# Patient Record
Sex: Male | Born: 1950 | Race: White | Hispanic: No | Marital: Married | Smoking: Never smoker
Health system: Southern US, Community
[De-identification: ages and names within clinical notes are randomized; demographics above are authoritative.]

## PROBLEM LIST (undated history)

## (undated) DIAGNOSIS — H409 Unspecified glaucoma: Secondary | ICD-10-CM

## (undated) DIAGNOSIS — K219 Gastro-esophageal reflux disease without esophagitis: Secondary | ICD-10-CM

## (undated) HISTORY — DX: Unspecified glaucoma: H40.9

## (undated) HISTORY — PX: SINOSCOPY: SHX187

## (undated) HISTORY — PX: OTHER SURGICAL HISTORY: SHX169

## (undated) HISTORY — DX: Gastro-esophageal reflux disease without esophagitis: K21.9

---

## 1998-05-04 ENCOUNTER — Ambulatory Visit (HOSPITAL_COMMUNITY): Admission: RE | Admit: 1998-05-04 | Discharge: 1998-05-04 | Payer: Self-pay | Admitting: Gastroenterology

## 1998-05-09 ENCOUNTER — Other Ambulatory Visit: Admission: RE | Admit: 1998-05-09 | Discharge: 1998-05-09 | Payer: Self-pay | Admitting: Urology

## 1999-06-05 ENCOUNTER — Encounter (INDEPENDENT_AMBULATORY_CARE_PROVIDER_SITE_OTHER): Payer: Self-pay | Admitting: Specialist

## 1999-06-05 ENCOUNTER — Encounter: Payer: Self-pay | Admitting: Emergency Medicine

## 1999-06-05 ENCOUNTER — Encounter: Payer: Self-pay | Admitting: Surgery

## 1999-06-05 ENCOUNTER — Inpatient Hospital Stay (HOSPITAL_COMMUNITY): Admission: EM | Admit: 1999-06-05 | Discharge: 1999-06-06 | Payer: Self-pay | Admitting: Emergency Medicine

## 2000-03-09 ENCOUNTER — Other Ambulatory Visit: Admission: RE | Admit: 2000-03-09 | Discharge: 2000-03-09 | Payer: Self-pay | Admitting: Urology

## 2000-04-22 ENCOUNTER — Encounter: Admission: RE | Admit: 2000-04-22 | Discharge: 2000-04-22 | Payer: Self-pay | Admitting: Internal Medicine

## 2000-04-22 ENCOUNTER — Encounter: Payer: Self-pay | Admitting: Internal Medicine

## 2003-10-04 ENCOUNTER — Encounter: Admission: RE | Admit: 2003-10-04 | Discharge: 2003-10-04 | Payer: Self-pay | Admitting: Internal Medicine

## 2004-11-04 ENCOUNTER — Encounter: Admission: RE | Admit: 2004-11-04 | Discharge: 2004-11-04 | Payer: Self-pay | Admitting: Internal Medicine

## 2008-03-06 ENCOUNTER — Encounter: Admission: RE | Admit: 2008-03-06 | Discharge: 2008-03-06 | Payer: Self-pay | Admitting: Gastroenterology

## 2008-08-28 ENCOUNTER — Encounter: Admission: RE | Admit: 2008-08-28 | Discharge: 2008-08-28 | Payer: Self-pay | Admitting: Internal Medicine

## 2010-02-25 ENCOUNTER — Encounter: Payer: Self-pay | Admitting: Internal Medicine

## 2010-05-06 ENCOUNTER — Other Ambulatory Visit: Payer: Self-pay | Admitting: Gastroenterology

## 2010-05-06 DIAGNOSIS — R1032 Left lower quadrant pain: Secondary | ICD-10-CM

## 2010-05-09 ENCOUNTER — Ambulatory Visit
Admission: RE | Admit: 2010-05-09 | Discharge: 2010-05-09 | Disposition: A | Discharge status: Home / Self Care | Payer: BC Managed Care – PPO | Source: Ambulatory Visit | Attending: Gastroenterology | Admitting: Gastroenterology

## 2010-05-09 DIAGNOSIS — R1032 Left lower quadrant pain: Secondary | ICD-10-CM

## 2010-05-09 MED ORDER — IOHEXOL 300 MG/ML  SOLN
100.0000 mL | Freq: Once | INTRAMUSCULAR | Status: AC | PRN
  Administered 2010-05-09: 100 mL via INTRAVENOUS

## 2010-06-21 NOTE — Discharge Summary (Signed)
[facility]  Patient:    Charles Winters, Charles Winters                     MRN: [id number] Adm. Date:  [id number] Disc. Date: [id number] Attending:  Meredith Leeds CC:         Florencia Reasons, M.D.                           Discharge Summary  DATE OF BIRTH:  1950-09-05.  DISCHARGE DIAGNOSES: 1. Acute suppurative appendicitis. 2. 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  OPERATION PERFORMED:  Patient had a laparoscopic appendectomy and a laparoscopic cholecystectomy with intraoperative cholangiogram on Jun 05, 1999.  HISTORY OF ILLNESS:  Charles Winters is a 60 year old white male who presented to the [facility] Emergency Room on Jun 05, 1999 with a four- to six-hour history of abdominal pain localized more epigastric/right upper quadrant and vomiting. This pain then localized to the right lower quadrant.  He was seen by Dr. Zigmund Daniel initially, who did the initial consultation.  Patient had known gallstones but no other significant gastrointestinal history.  He had seen Dr. Katy Fitch Buccini and had an upper endoscopy and colonoscopy about April of 1990.  PHYSICAL EXAMINATION:  He had right upper quadrant tenderness, which was may more to the right lower quadrant by the time Dr. Orson Slick saw him.  He had guarding and some rebound.  He had a white blood count of 23,300.  It was Dr. Marcellina Millin impression that he had probable acute appendicitis.  HOSPITAL COURSE:  Since the patient had significant symptoms of gallbladder too, we discussed that if the appendix surgery went well, could consider doing an cholecystectomy at the same time.  Therefore, he was taken to the operating room where he underwent a laparoscopic appendectomy and then I proceeded with a laparoscopic cholecystectomy and intraoperative cholangiogram.  Postoperatively, the patient did well.  He felt much better the day after surgery.  He was kept through lunch but  then discharged home afterwards when he was keeping liquids down well enough and ready for discharge.  His final pathology came back with acute suppurative appendicitis of the appendix and chronic cholecystitis, cholelithiasis and cholesterolosis of the gallbladder.  DIET:  Low-fat diet.  ACTIVITIES:  No driving for about three days.  FOLLOWUP:  He was to see me back in about two weeks for followup.  DISCHARGE MEDICATIONS:  He was given Vicodin for pain.  SPECIAL INSTRUCTIONS:  Call for any problems. DD:  06/25/99 TD:  06/27/99 Job: [id number]

## 2010-06-21 NOTE — Op Note (Signed)
[place]  Patient:    Charles Winters, Charles Winters                     MRN: [id number] Proc. Date: 06/05/99 Adm. Date:  [id number] Disc. Date: [id number] Attending:  Meredith Leeds CC:         Florencia Reasons, M.D.                           Operative Report  DATE OF BIRTH:  April 22, 1960  PREOPERATIVE DIAGNOSES: 1. Acute appendicitis. 2. Cholelithiasis.  POSTOPERATIVE DIAGNOSES: 1. Acute appendicitis. 2. Chronic cholecystitis with cholelithiasis.  PROCEDURES: 1. Laparoscopic appendectomy. 2. Laparoscopic cholecystectomy with intraoperative cholangiogram.  SURGEON:  Sandria Bales. Ezzard Standing, M.D.  FIRST ASSISTANT:  None.  ANESTHESIA:  General endotracheal.  ESTIMATED BLOOD LOSS:  Minimal.  INDICATIONS:  Mr. Parkerson is a 60 year old white male who presents with right lower quadrant pain and leukocytosis.  He initially was felt to have cholecystitis by the emergency room and was evaluated by Zigmund Daniel, M.D., who thought he had appendicitis.  He does have documented gallstones by ultrasound, but no evidence of acute cholecystitis.  The patient comes in for attempted laparoscopic exploration with probable appendectomy and possible cholecystectomy.  A full discussion of the indications and complications was had with the patient and his brother.  DESCRIPTION OF PROCEDURE:  The patient was placed in the supine position with his left arm tucked to his side and his right arm out.  He had PS stockings in place.  He had been given Ancef for preoperative antibiotics.  His abdomen was shaved, prepped with Betadine solution, and sterilely draped.  He had a Foley catheter in place.  An infraumbilical incision was made with sharp dissection and carried down into the abdominal cavity.  The 30-degree laparoscope was inserted through a 12 mm Hasson trocar and secured with a 0 Vicryl suture.  The abdominal exploration carried out revealed that the right  and left lobes of the liver were unremarkable.  The gallbladder had kind of a fatty, chronic looking inflammation around the bottom half, but the top half looked okay. The stomach was unremarkable.  The appendix was acutely inflamed.  Attention was first paid to the appendix.  Two additional trocars were placed, a 5 mm Ethicon trocar in the right subcostal location and a 10 mm Ethicon trocar in the left lower quadrant location.  The mesentery was taken down using the harmonic scalpel.  The base of the appendix was identified.  Using a vascular endo GIA stapler, the Ethicon was fired across the base with a clean staple line.  The appendix was then placed in a bag, delivered through the umbilicus, and sent to pathology.  The abdomen was then irrigated.  Even though the appendix was acutely inflamed, it had minimal gross contamination.  The procedure went fairly smoothly.  We turned our attention then to his gallbladder which did appear to have the appearance of chronic cholecystitis.  The gallbladder was grabbed and rotated cephalad.  Dissection was carried out in a fairly difficult manner along the gallbladder/cystic duct junction.  I identified the cyst duct well, triply endoclipped the cystic artery, placed two clips on the gallbladder side of the cystic duct, and shot an intraoperative cholangiogram.  The intraoperative cholangiogram showed free flow of contrast down the common bile duct into the duodenum.  There was no obstruction, no mass, no filling defect, and  no leak.  It was felt to be a normal intraoperative cholangiogram.  The taut catheter was then removed.  The cystic duct was triply endoclipped and divided.  The gallbladder was then sharply and bluntly dissected from the gallbladder bed and easily delivered into the endocatch bag.  Before complete division of the gallbladder from the gallbladder bed, the gallbladder bed and the triangle of Calot were visualized.  There  was no bleeding or bile leak in these areas.  The umbilical incision was closed with a 0 Vicryl suture.  The skin at each site was closed with a 5-0 Vicryl suture and painted with tincture of Benzoin and steri-stripped and sterilely dressed.  The patient tolerated the procedure well.  The sponge and needle counts were correct at the end of the case. DD:  06/05/99 TD:  06/07/99 Job: [id number]

## 2010-09-24 ENCOUNTER — Encounter (INDEPENDENT_AMBULATORY_CARE_PROVIDER_SITE_OTHER): Payer: Self-pay | Admitting: Surgery

## 2013-11-08 ENCOUNTER — Other Ambulatory Visit: Payer: Self-pay | Admitting: Otolaryngology

## 2013-11-08 DIAGNOSIS — K219 Gastro-esophageal reflux disease without esophagitis: Secondary | ICD-10-CM

## 2013-11-10 ENCOUNTER — Other Ambulatory Visit: Payer: BC Managed Care – PPO

## 2013-11-16 ENCOUNTER — Ambulatory Visit
Admission: RE | Admit: 2013-11-16 | Discharge: 2013-11-16 | Disposition: A | Discharge status: Home / Self Care | Payer: BC Managed Care – PPO | Source: Ambulatory Visit | Attending: Otolaryngology | Admitting: Otolaryngology

## 2013-11-16 DIAGNOSIS — K219 Gastro-esophageal reflux disease without esophagitis: Secondary | ICD-10-CM

## 2013-11-22 ENCOUNTER — Other Ambulatory Visit: Payer: Self-pay | Admitting: Otolaryngology

## 2013-11-22 DIAGNOSIS — K2100 Gastro-esophageal reflux disease with esophagitis, without bleeding: Secondary | ICD-10-CM

## 2013-11-24 ENCOUNTER — Ambulatory Visit
Admission: RE | Admit: 2013-11-24 | Discharge: 2013-11-24 | Disposition: A | Discharge status: Home / Self Care | Payer: BC Managed Care – PPO | Source: Ambulatory Visit | Attending: Otolaryngology | Admitting: Otolaryngology

## 2013-11-24 DIAGNOSIS — K2100 Gastro-esophageal reflux disease with esophagitis, without bleeding: Secondary | ICD-10-CM

## 2013-12-01 ENCOUNTER — Other Ambulatory Visit: Payer: Self-pay | Admitting: Otolaryngology

## 2013-12-01 DIAGNOSIS — E041 Nontoxic single thyroid nodule: Secondary | ICD-10-CM

## 2013-12-08 ENCOUNTER — Other Ambulatory Visit (HOSPITAL_COMMUNITY)
Admission: RE | Admit: 2013-12-08 | Discharge: 2013-12-08 | Disposition: A | Discharge status: Home / Self Care | Payer: BC Managed Care – PPO | Source: Ambulatory Visit | Attending: Interventional Radiology | Admitting: Interventional Radiology

## 2013-12-08 ENCOUNTER — Ambulatory Visit
Admission: RE | Admit: 2013-12-08 | Discharge: 2013-12-08 | Disposition: A | Discharge status: Home / Self Care | Payer: BC Managed Care – PPO | Source: Ambulatory Visit | Attending: Otolaryngology | Admitting: Otolaryngology

## 2013-12-08 DIAGNOSIS — E041 Nontoxic single thyroid nodule: Secondary | ICD-10-CM | POA: Insufficient documentation

## 2013-12-21 ENCOUNTER — Other Ambulatory Visit: Payer: Self-pay | Admitting: Otolaryngology

## 2014-03-01 ENCOUNTER — Ambulatory Visit
Admission: RE | Admit: 2014-03-01 | Discharge: 2014-03-01 | Disposition: A | Discharge status: Home / Self Care | Payer: BC Managed Care – PPO | Source: Ambulatory Visit | Attending: Internal Medicine | Admitting: Internal Medicine

## 2014-03-01 ENCOUNTER — Other Ambulatory Visit: Payer: Self-pay | Admitting: Internal Medicine

## 2014-03-01 DIAGNOSIS — M40204 Unspecified kyphosis, thoracic region: Secondary | ICD-10-CM

## 2014-04-12 ENCOUNTER — Ambulatory Visit: Payer: BC Managed Care – PPO | Attending: Internal Medicine | Admitting: Physical Therapy

## 2014-04-12 DIAGNOSIS — R293 Abnormal posture: Secondary | ICD-10-CM | POA: Diagnosis not present

## 2014-04-12 DIAGNOSIS — M40204 Unspecified kyphosis, thoracic region: Secondary | ICD-10-CM | POA: Insufficient documentation

## 2014-04-12 DIAGNOSIS — M6281 Muscle weakness (generalized): Secondary | ICD-10-CM | POA: Diagnosis present

## 2014-04-12 NOTE — Patient Instructions (Signed)
Posture Tips DO: - stand tall and erect - keep chin tucked in - keep head and shoulders in alignment - check posture regularly in mirror or large window - pull head back against headrest in car seat;  Change your position often.  Sit with lumbar support. DON'T: - slouch or slump while watching TV or reading - sit, stand or lie in one position  for too long;  Sitting is especially hard on the spine so if you sit at a desk/use the computer, then stand up often!   Copyright  VHI. All rights reserved.  Posture - Standing   Good posture is important. Avoid slouching and forward head thrust. Maintain curve in low back and align ears over shoul- ders, hips over ankles.  Pull your belly button in toward your back bone.   Copyright  VHI. All rights reserved.  Posture - Sitting   Sit upright, head facing forward. Try using a roll to support lower back. Keep shoulders relaxed, and avoid rounded back. Keep hips level with knees. Avoid crossing legs for long periods.   Copyright  VHI. All rights reserved.    Posture Tips DO: - stand tall and erect - keep chin tucked in - keep head and shoulders in alignment - check posture regularly in mirror or large window - pull head back against headrest in car seat;  Change your position often.  Sit with lumbar support. DON'T: - slouch or slump while watching TV or reading - sit, stand or lie in one position  for too long;  Sitting is especially hard on the spine so if you sit at a desk/use the computer, then stand up often!   Copyright  VHI. All rights reserved.  Posture - Standing   Good posture is important. Avoid slouching and forward head thrust. Maintain curve in low back and align ears over shoul- ders, hips over ankles.  Pull your belly button in toward your back bone.   Copyright  VHI. All rights reserved.  Posture - Sitting   Sit upright, head facing forward. Try using a roll to support lower back. Keep shoulders relaxed, and avoid  rounded back. Keep hips level with knees. Avoid crossing legs for long periods.   Copyright  VHI. All rights reserved.   Sleeping on Back  Place pillow under knees. A pillow with cervical support and a roll around waist are also helpful. Copyright  VHI. All rights reserved.  Sleeping on Side Place pillow between knees. Use cervical support under neck and a roll around waist as needed. Copyright  VHI. All rights reserved.   Sleeping on Stomach   If this is the only desirable sleeping position, place pillow under lower legs, and under stomach or chest as needed.  Posture - Sitting   Sit upright, head facing forward. Try using a roll to support lower back. Keep shoulders relaxed, and avoid rounded back. Keep hips level with knees. Avoid crossing legs for long periods. Stand to Sit / Sit to Stand   To sit: Bend knees to lower self onto front edge of chair, then scoot back on seat. To stand: Reverse sequence by placing one foot forward, and scoot to front of seat. Use rocking motion to stand up.   Work Height and Reach  Ideal work height is no more than 2 to 4 inches below elbow level when standing, and at elbow level when sitting. Reaching should be limited to arm's length, with elbows slightly bent.  Bending  Bend at hips   back. Keep feet shoulder-width apart.    Posture - Standing   Good posture is important. Avoid slouching and forward head thrust. Maintain curve in low back and align ears over shoul- ders, hips over ankles.  Alternating Positions   Alternate tasks and change positions frequently to reduce fatigue and muscle tension. Take rest breaks. Computer Work   Position work to Art gallery managerface forward. Use proper work and seat height. Keep shoulders back and down, wrists straight, and elbows at right angles. Use chair that provides full back support. Add footrest and lumbar roll as needed.  Getting Into / Out of Car  Lower self onto seat, scoot back, then bring in  one leg at a time. Reverse sequence to get out.  Dressing  Lie on back to pull socks or slacks over feet, or sit and bend leg while keeping back straight.    Housework - Sink  Place one foot on ledge of cabinet under sink when standing at sink for prolonged periods.   Pushing / Pulling  Pushing is preferable to pulling. Keep back in proper alignment, and use leg muscles to do the work.  Deep Squat   Squat and lift with both arms held against upper trunk. Tighten stomach muscles without holding breath. Use smooth movements to avoid jerking.  Avoid Twisting   Avoid twisting or bending back. Pivot around using foot movements, and bend at knees if needed when reaching for articles.  Carrying Luggage   Distribute weight evenly on both sides. Use a cart whenever possible. Do not twist trunk. Move body as a unit.   Lifting Principles .Maintain proper posture and head alignment. .Slide object as close as possible before lifting. .Move obstacles out of the way. .Test before lifting; ask for help if too heavy. .Tighten stomach muscles without holding breath. .Use smooth movements; do not jerk. .Use legs to do the work, and pivot with feet. .Distribute the work load symmetrically and close to the center of trunk. .Push instead of pull whenever possible.   Ask For Help   Ask for help and delegate to others when possible. Coordinate your movements when lifting together, and maintain the low back curve.  Log Roll   Lying on back, bend left knee and place left arm across chest. Roll all in one movement to the right. Reverse to roll to the left. Always move as one unit. Housework - Sweeping  Use long-handled equipment to avoid stooping.   Housework - Wiping  Position yourself as close as possible to reach work surface. Avoid straining your back.  Laundry - Unloading Wash   To unload small items at bottom of washer, lift leg opposite to arm being used to  reach.  Gardening - Raking  Move close to area to be raked. Use arm movements to do the work. Keep back straight and avoid twisting.     Cart  When reaching into cart with one arm, lift opposite leg to keep back straight.   Getting Into / Out of Bed  Lower self to lie down on one side by raising legs and lowering head at the same time. Use arms to assist moving without twisting. Bend both knees to roll onto back if desired. To sit up, start from lying on side, and use same move-ments in reverse. Housework - Vacuuming  Hold the vacuum with arm held at side. Step back and forth to move it, keeping head up. Avoid twisting.   Laundry - Armed forces training and education officerLoading Wash  Position laundry basket  Position laundry basket so that bending and twisting can be avoided.   Laundry - Unloading Dryer  Squat down to reach into clothes dryer or use a reacher.  Gardening - Weeding / Planting  Squat or Kneel. Knee pads may be helpful.                      

## 2014-04-12 NOTE — Therapy (Signed)
[facility] [address] [place], [place], [address] Phone: [phone]   Fax:  [phone]  Physical Therapy Treatment  Patient Details  Name: Charles Winters MRN: [id number] Date of Birth: 09/30/1950 Referring Provider:  Talmage CoinKerr, Jeffrey, MD  Encounter Date: 04/12/2014      PT End of Session - 04/12/14 1307    Visit Number 1   Date for PT Re-Evaluation 05/24/14   PT Start Time 1225   PT Stop Time 1308   PT Time Calculation (min) 43 min   Activity Tolerance Patient tolerated treatment well   Behavior During Therapy [facility] for tasks assessed/performed      No past medical history on file.  No past surgical history on file.  There were no vitals taken for this visit.  Visit Diagnosis:  Weakness of trunk musculature - Plan: PT plan of care cert/re-cert  Abnormal posture - Plan: PT plan of care cert/re-cert      Subjective Assessment - 04/12/14 1230    Symptoms Patient reports he has difficulty standing straight, inceased thoracic kyphosis. Patient has difficulty  with breathing when hunched over.    Limitations Lifting;Standing;Walking;Sitting   How long can you sit comfortably? no difficulty   How long can you stand comfortably? 3 min then low back pain   How long can you walk comfortably? no difficulty   Patient Stated Goals increased discomfort in back due to decreased strength of back    Currently in Pain? Yes   Pain Score 2    Pain Location Back   Pain Orientation Mid;Lower;Upper   Pain Descriptors / Indicators Aching;Dull   Pain Type Chronic pain   Pain Onset Other (comment)  2 years ago   Pain Frequency Intermittent   Aggravating Factors  standing  and lifting, household activities   Pain Relieving Factors rotate cold pack and hot pack   Effect of Pain on Daily Activities difficult with vacuuming, mopping, gardening   Multiple Pain Sites No          [facility] PT Assessment - 04/12/14 0001    Assessment   Medical Diagnosis DDD of spine; loss of height; kyphosis of thoracic spine; mid back pain   Onset Date 02/04/12   Prior Therapy yes   Precautions   Precautions None   Balance Screen   Has the patient fallen in the past 6 months No   Has the patient had a decrease in activity level because of a fear of falling?  No   Is the patient reluctant to leave their home because of a fear of falling?  No   Prior Function   Level of Independence Independent with basic ADLs   Observation/Other Assessments   Focus on Therapeutic Outcomes (FOTO)  39% limitation   Posture/Postural Control   Posture/Postural Control Postural limitations   Postural Limitations Rounded Shoulders;Forward head;Decreased lumbar lordosis;Increased thoracic kyphosis;Posterior pelvic tilt;Flexed trunk   Posture Comments --  66.25 inches   ROM / Strength   AROM / PROM / Strength --  back strength is 3/5   AROM   Lumbar Flexion full   Lumbar Extension decreased by 50%   Lumbar - Right Side Bend full   Lumbar - Left Side Bend full   Thoracic - Right Side Bend full   Thoracic - Left Side Bend full   Thoracic - Right Rotation full   Thoracic - Left Rotation full   Flexibility   Soft Tissue Assessment /Muscle Length yes  Hamstrings tight   Quadriceps tight   Piriformis tight   Palpation   Palpation Palpable tenderness located in bilateral lumbar paraspinals                          PT Education - 10-May-2014 1307    Education provided Yes   Education Details education on posture and body mechanincs   Person(s) Educated Patient   Methods Explanation;Demonstration;Handout;Verbal cues   Comprehension Verbalized understanding;Returned demonstration          PT Short Term Goals - 10-May-2014 1311    PT SHORT TERM GOAL #1   Title understand correct body mechanics with home and gardening tasks to decreased strain on spine   Time 3   Period Weeks   Status New   PT SHORT TERM GOAL #2    Title fatique in back muscles with vacuuming and gardening decreased >/= 25%   Time 3   Period Weeks   Status New   PT SHORT TERM GOAL #3   Title while performing upright acitivites breathing improved >/= 25% due to increased trunk extension   Time 3   Period Weeks   Status New   PT SHORT TERM GOAL #4   Title pain with upright activities reduced >/= 25% due to increased trunk strength   Time 3   Period Weeks   Status New           PT Long Term Goals - May 10, 2014 1313    PT LONG TERM GOAL #1   Title understand how to exercise at the gym correctly and increased trunk strength   Time 6   Period Weeks   Status New   PT LONG TERM GOAL #2   Title fatique in back muscle decreased >/= 75% due to increased trunk strength   Time 6   Period Weeks   Status New   PT LONG TERM GOAL #3   Title while performing upright activities breathing improved by 75% due to increased trunk extension   Time 6   Period Weeks   Status New   PT LONG TERM GOAL #4   Title pain with upright activities improved >/= 60% due to increased strength   Time 6   Period Weeks   Status New               Plan - 2014/05/10 1308    Clinical Impression Statement Patient has weak back muscles making it difficult for him to stand upright with daily activities, difficulty with breathing, and causes back pain.   Pt will benefit from skilled therapeutic intervention in order to improve on the following deficits Decreased range of motion;Difficulty walking;Impaired flexibility;Improper body mechanics;Postural dysfunction;Decreased endurance;Decreased activity tolerance;Increased fascial restricitons;Pain;Increased muscle spasms;Decreased mobility;Decreased strength   Rehab Potential Good   Clinical Impairments Affecting Rehab Potential None   PT Frequency 2x / week   PT Duration 6 weeks   PT Treatment/Interventions Moist Heat;Therapeutic activities;Patient/family education;Passive range of motion;Therapeutic  exercise;Ultrasound;Manual techniques;Neuromuscular re-education;Cryotherapy;Electrical Stimulation;Functional mobility training   PT Next Visit Plan work on back and thoracic strengthening.  Review posture, Soft tissue work to spine, flexibility exercises, stretch abdominals   PT Home Exercise Plan interscapular exercises   Recommended Other Services None   Consulted and Agree with Plan of Care Patient          G-Codes - 10-May-2014 1223    Functional Assessment Tool Used FOTO score is 39% limitation   Functional Limitation Other PT  primary   Other PT Primary Current Status [phone]) At least 20 percent but less than 40 percent impaired, limited or restricted   Other PT Primary Goal Status ([id number]) At least 20 percent but less than 40 percent impaired, limited or restricted      Problem List There are no active problems to display for this patient.   Jalasia Eskridge, PT 04/12/2014, 1:20 PM  [facility] [address] [place], [place], [address] Phone: [phone]   Fax:  [phone]

## 2014-04-17 ENCOUNTER — Ambulatory Visit: Payer: BC Managed Care – PPO | Admitting: Physical Therapy

## 2014-04-19 ENCOUNTER — Encounter: Payer: BC Managed Care – PPO | Admitting: Physical Therapy

## 2014-05-01 ENCOUNTER — Ambulatory Visit: Payer: BC Managed Care – PPO | Admitting: Physical Therapy

## 2014-05-01 ENCOUNTER — Encounter: Payer: Self-pay | Admitting: Physical Therapy

## 2014-05-01 DIAGNOSIS — R293 Abnormal posture: Secondary | ICD-10-CM

## 2014-05-01 DIAGNOSIS — M6281 Muscle weakness (generalized): Secondary | ICD-10-CM | POA: Diagnosis not present

## 2014-05-01 NOTE — Therapy (Signed)
[facility] [address], [address] [place], [place], [address] Phone: [phone]   Fax:  [phone]  Physical Therapy Treatment  Patient Details  Name: Charles Winters MRN: [id number] Date of Birth: 1950/09/16 Referring Provider:  Talmage CoinKerr, Jeffrey, MD  Encounter Date: 05/01/2014      PT End of Session - 05/01/14 1333    Visit Number 2   Date for PT Re-Evaluation 05/24/14   PT Start Time 1144   PT Stop Time 1230   PT Time Calculation (min) 46 min   Activity Tolerance Patient tolerated treatment well   Behavior During Therapy [facility] for tasks assessed/performed      History reviewed. No pertinent past medical history.  History reviewed. No pertinent past surgical history.  There were no vitals filed for this visit.  Visit Diagnosis:  Weakness of trunk musculature  Abnormal posture      Subjective Assessment - 05/01/14 1151    Symptoms Patient reports he noticed he is slouching and difficulty standing straight, inceased thoracic kyphosis. Patient has difficulty  with breathing when hunched over.    Limitations Sitting;Lifting;Standing;Walking   How long can you sit comfortably? no difficulty   How long can you stand comfortably? 3 min then low back pain   How long can you walk comfortably? no difficulty   Patient Stated Goals decreased discomfort in back due to increased strength of back    Currently in Pain? Yes   Pain Score 2    Pain Location Back   Pain Orientation Right;Left;Upper   Pain Descriptors / Indicators Aching;Dull   Pain Type Chronic pain   Pain Onset Other (comment)   Pain Frequency Intermittent   Pain Relieving Factors cold or hot pack   Effect of Pain on Daily Activities difficult with vaccuuming, mopping, gardening, coming up from flexed position   Multiple Pain Sites No                       [facility] Adult PT Treatment/Exercise - 05/01/14 0001    Exercises   Exercises  Shoulder;Lumbar  Thoracic   Lumbar Exercises: Aerobic   UBE (Upper Arm Bike) 6 (3/3)    Lumbar Exercises: Supine   Other Supine Lumbar Exercises Foam roll x 3 min, for elongation & decompression   Other Supine Lumbar Exercises Foam roll red t-band ABD & overhead flexion 3 x10each  overhead flexion with constant pull for strength   Lumbar Exercises: Prone   Other Prone Lumbar Exercises Prone on forearms 2x531min  needed v/c and tactile cues for proper posture   Other Prone Lumbar Exercises bil UE lift head in neutral extension 2x 10   Shoulder Exercises: Seated   Horizontal ABduction AROM;20 reps  with red t-band   Shoulder Exercises: Stretch   Corner Stretch 3 reps;20 seconds  repeat x 2, with LE in front alternatin                PT Education - 05/01/14 1331    Education provided Yes   Education Details t-band red unattached   Person(s) Educated Parent(s)   Methods Explanation;Demonstration;Handout   Comprehension Verbalized understanding          PT Short Term Goals - 05/01/14 1340    PT SHORT TERM GOAL #1   Title understand correct body mechanics with home and gardening tasks to decreased strain on spine   Time 3   Period Weeks   Status On-going   PT SHORT  TERM GOAL #2   Title fatique in back muscles with vacuuming and gardening decreased >/= 25%   Time 3   Period Weeks   Status On-going   PT SHORT TERM GOAL #3   Title while performing upright acitivites breathing improved >/= 25% due to increased trunk extension   Time 3   Period Weeks   Status On-going   PT SHORT TERM GOAL #4   Title pain with upright activities reduced >/= 25% due to increased trunk strength   Time 3   Period Weeks   Status On-going           PT Long Term Goals - 05/01/14 1341    PT LONG TERM GOAL #1   Title understand how to exercise at the gym correctly and increased trunk strength   Time 6   Period Weeks   Status On-going   PT LONG TERM GOAL #2   Title fatique in back  muscle decreased >/= 75% due to increased trunk strength   Time 6   Period Weeks   Status On-going   PT LONG TERM GOAL #3   Title while performing upright activities breathing improved by 75% due to increased trunk extension   Time 6   Period Weeks   Status On-going   PT LONG TERM GOAL #4   Title pain with upright activities improved >/= 60% due to increased strength   Time 6   Period Weeks   Status On-going               Plan - 05/01/14 1334    Clinical Impression Statement Patient with weak back muscles making it difficult for him to stand upright with daily activities, what causes back pain and difficulties with breathing   Pt will benefit from skilled therapeutic intervention in order to improve on the following deficits Decreased range of motion;Difficulty walking;Impaired flexibility;Improper body mechanics;Postural dysfunction;Decreased endurance;Decreased activity tolerance;Increased fascial restricitons;Pain;Increased muscle spasms;Decreased mobility;Decreased strength   Rehab Potential Good   Clinical Impairments Affecting Rehab Potential none   PT Frequency 2x / week   PT Duration 6 weeks   PT Treatment/Interventions Moist Heat;Therapeutic activities;Patient/family education;Passive range of motion;Therapeutic exercise;Ultrasound;Manual techniques;Neuromuscular re-education;Cryotherapy;Electrical Stimulation;Functional mobility training   PT Next Visit Plan work on back and thoracic strengthening.  Review posture, Soft tissue work to spine, flexibility exercises, stretch abdominals   PT Home Exercise Plan interscapular exercises, stretching,    Recommended Other Services none   Consulted and Agree with Plan of Care Patient        Problem List There are no active problems to display for this patient.   NAUMANN-HOUEGNIFIO,Lanetta Figuero PTA 05/01/2014, 1:46 PM  [facility] [address] [place], [place],  [address] Phone: [phone]   Fax:  [phone]

## 2014-05-01 NOTE — Patient Instructions (Signed)
  PNF Strengthening: Resisted   Standing with resistive band around each hand, bring right arm up and away, thumb back. Repeat _10___ times per set. Do _2___ sets per session. Do _1-2___ sessions per day.      Resisted Horizontal Abduction: Bilateral   Sit or stand, tubing in both hands, arms out in front. Keeping arms straight, pinch shoulder blades together and stretch arms out. Repeat _10___ times per set. Do 2____ sets per session. Do _1-2___ sessions per day.                  Scapular Retraction: Elbow Flexion (Standing)   With elbows bent to 90, pinch shoulder blades together and rotate arms out, keeping elbows bent. Repeat _10___ times per set. Do _1___ sets per session. Do many____ sessions per day.    Strengthening: Resisted Extension   Hold tubing in right hand, arm forward. Pull arm back, elbow straight. Repeat _10___ times per set. Do _2___ sets per session. Do _1-2___ sessions per day.  Can place band around the front of your body and perform with both arms at the same time.   

## 2014-05-03 ENCOUNTER — Encounter: Payer: Self-pay | Admitting: Physical Therapy

## 2014-05-03 ENCOUNTER — Ambulatory Visit: Payer: BC Managed Care – PPO | Admitting: Physical Therapy

## 2014-05-03 DIAGNOSIS — M6281 Muscle weakness (generalized): Secondary | ICD-10-CM

## 2014-05-03 DIAGNOSIS — R293 Abnormal posture: Secondary | ICD-10-CM

## 2014-05-03 NOTE — Therapy (Signed)
[facility] [address], [address] [place], [place], [address] Phone: [phone]   Fax:  [phone]  Physical Therapy Treatment  Patient Details  Name: Charles CarrowJames C Winters MRN: [id number] Date of Birth: 24-Dec-1950 Referring Provider:  Talmage CoinKerr, Jeffrey, MD  Encounter Date: 05/03/2014      PT End of Session - 05/03/14 1155    Visit Number 3   Date for PT Re-Evaluation 05/24/14   PT Start Time 1146   PT Stop Time 1230   PT Time Calculation (min) 44 min   Activity Tolerance Patient tolerated treatment well   Behavior During Therapy [facility] for tasks assessed/performed      History reviewed. No pertinent past medical history.  History reviewed. No pertinent past surgical history.  There were no vitals filed for this visit.  Visit Diagnosis:  Weakness of trunk musculature  Abnormal posture                     [facility] Adult PT Treatment/Exercise - 05/03/14 0001    Lumbar Exercises: Aerobic   UBE (Upper Arm Bike) L2 6(3/3)   Lumbar Exercises: Seated   Other Seated Lumbar Exercises Foam roll rolling forward and back in supine for thoracic flex   Lumbar Exercises: Supine   Other Supine Lumbar Exercises Foam roll x 2 min, for elongation & decompression   Other Supine Lumbar Exercises Foam roll red t-band ABD & overhead flexion 3 x10each  added D2, overhead flexion with constant pull for strength   Shoulder Exercises: Standing   ABduction Strengthening   Theraband Level (Shoulder ABduction) Level 2 (Red)  back against wall    Shoulder Exercises: Stretch   Corner Stretch 3 reps;20 seconds  answer pt. questions in regard to technique                PT Education - 05/03/14 1218    Education provided Yes   Education Details t-band unattached   Person(s) Educated Patient   Methods Explanation;Demonstration;Verbal cues;Handout  exercise was on HEP handout from last visit   Comprehension Verbalized  understanding;Returned demonstration          PT Short Term Goals - 05/01/14 1340    PT SHORT TERM GOAL #1   Title understand correct body mechanics with home and gardening tasks to decreased strain on spine   Time 3   Period Weeks   Status On-going   PT SHORT TERM GOAL #2   Title fatique in back muscles with vacuuming and gardening decreased >/= 25%   Time 3   Period Weeks   Status On-going   PT SHORT TERM GOAL #3   Title while performing upright acitivites breathing improved >/= 25% due to increased trunk extension   Time 3   Period Weeks   Status On-going   PT SHORT TERM GOAL #4   Title pain with upright activities reduced >/= 25% due to increased trunk strength   Time 3   Period Weeks   Status On-going           PT Long Term Goals - 05/01/14 1341    PT LONG TERM GOAL #1   Title understand how to exercise at the gym correctly and increased trunk strength   Time 6   Period Weeks   Status On-going   PT LONG TERM GOAL #2   Title fatique in back muscle decreased >/= 75% due to increased trunk strength   Time 6   Period Weeks  Status On-going   PT LONG TERM GOAL #3   Title while performing upright activities breathing improved by 75% due to increased trunk extension   Time 6   Period Weeks   Status On-going   PT LONG TERM GOAL #4   Title pain with upright activities improved >/= 60% due to increased strength   Time 6   Period Weeks   Status On-going               Plan - 05/03/14 1156    Clinical Impression Statement Patient reports soreness in upper back and neck area after last session. Pt with kypotic posture and weak postural muscles   Rehab Potential Good   Clinical Impairments Affecting Rehab Potential none   PT Frequency 2x / week   PT Duration 6 weeks   PT Treatment/Interventions Moist Heat;Therapeutic activities;Patient/family education;Passive range of motion;Therapeutic exercise;Ultrasound;Manual techniques;Neuromuscular  re-education;Cryotherapy;Electrical Stimulation;Functional mobility training   PT Next Visit Plan work on back and thoracic strengthening.  Review posture, Soft tissue work to spine, flexibility exercises, stretch abdominals   PT Home Exercise Plan interscapular exercises, stretching,    Recommended Other Services none   Consulted and Agree with Plan of Care Patient        Problem List There are no active problems to display for this patient.   NAUMANN-HOUEGNIFIO,Yanessa Hocevar PTA 05/03/2014, 12:29 PM  [facility] [address] [place], [place], [address] Phone: [phone]   Fax:  [phone]

## 2014-05-08 ENCOUNTER — Encounter: Payer: Self-pay | Admitting: Physical Therapy

## 2014-05-08 ENCOUNTER — Ambulatory Visit: Payer: BC Managed Care – PPO | Attending: Internal Medicine | Admitting: Physical Therapy

## 2014-05-08 DIAGNOSIS — R293 Abnormal posture: Secondary | ICD-10-CM | POA: Diagnosis not present

## 2014-05-08 DIAGNOSIS — M40204 Unspecified kyphosis, thoracic region: Secondary | ICD-10-CM | POA: Diagnosis not present

## 2014-05-08 DIAGNOSIS — M6281 Muscle weakness (generalized): Secondary | ICD-10-CM

## 2014-05-08 NOTE — Therapy (Signed)
[facility] [address] [place], [place], [address] Phone: [phone]   Fax:  [phone]  Physical Therapy Treatment  Patient Details  Name: Charles Winters MRN: [id number] Date of Birth: 1950/02/24 Referring Provider:  Talmage Coin, MD  Encounter Date: 05/08/2014      PT End of Session - 05/08/14 1235    Visit Number 4   Date for PT Re-Evaluation 05/24/14   PT Start Time 1147   PT Stop Time 1230   PT Time Calculation (min) 43 min   Activity Tolerance Patient tolerated treatment well   Behavior During Therapy [facility] for tasks assessed/performed      History reviewed. No pertinent past medical history.  History reviewed. No pertinent past surgical history.  There were no vitals filed for this visit.  Visit Diagnosis:  Weakness of trunk musculature  Abnormal posture      Subjective Assessment - 05/08/14 1154    Subjective Patient was sitting prolonged times this weekend and feels stiffness in body mainly in low back   Limitations Sitting;Lifting;Standing;Walking   How long can you stand comfortably? 10 min then has to sit down   How long can you walk comfortably? no difficulty   Patient Stated Goals decreased discomfort in back due to increased strength of back    Currently in Pain? No/denies  no pain, but stiffness                       [facility] Adult PT Treatment/Exercise - 05/08/14 0001    Exercises   Exercises Neck   Neck Exercises: Prone   Neck Retraction 20 reps  for strengthening, needs vc/s for technique   Lumbar Exercises: Aerobic   UBE (Upper Arm Bike) L3 6(3/3)   Lumbar Exercises: Seated   Other Seated Lumbar Exercises Foam roll rolling forward/back in supine for thoracic flex  2 x 10 pt will recieve a foam roll tomorrow   Lumbar Exercises: Supine   Other Supine Lumbar Exercises Foam roll x 2 min, for elongation & decompression   Other Supine Lumbar Exercises Foam roll red  t-band ABD & constant pull into overhead flexion, and D2 3 x10each  added D2, overhead flexion with constant pull for strength   Shoulder Exercises: Standing   ABduction Strengthening;20 reps   Theraband Level (Shoulder ABduction) Level 2 (Red)  back against wall    Shoulder Exercises: Stretch   Corner Stretch 3 reps;20 seconds  answer pt. questions in regard to technique                  PT Short Term Goals - 05/08/14 1232    PT SHORT TERM GOAL #1   Title understand correct body mechanics with home and gardening tasks to decreased strain on spine   Time 3   Period Weeks   Status Achieved   PT SHORT TERM GOAL #2   Title fatique in back muscles with vacuuming and gardening decreased >/= 25%   Time 3   Period Weeks   Status On-going   PT SHORT TERM GOAL #3   Title while performing upright acitivites breathing improved >/= 25% due to increased trunk extension   Time 3   Period Weeks   Status On-going   PT SHORT TERM GOAL #4   Title pain with upright activities reduced >/= 25% due to increased trunk strength   Time 3   Period Weeks   Status On-going  PT Long Term Goals - 05/08/14 1233    PT LONG TERM GOAL #1   Title understand how to exercise at the gym correctly and increased trunk strength  Pt ordered faom roll for home use   Time 6   Period Weeks   Status On-going   PT LONG TERM GOAL #2   Title fatique in back muscle decreased >/= 75% due to increased trunk strength   Time 6   Period Weeks   Status On-going   PT LONG TERM GOAL #3   Title while performing upright activities breathing improved by 75% due to increased trunk extension   Time 6   Period Weeks   Status On-going   PT LONG TERM GOAL #4   Title pain with upright activities improved >/= 60% due to increased strength   Time 6   Period Weeks   Status On-going               Plan - 05/08/14 1236    Clinical Impression Statement Pt. presents with improved posture   Rehab  Potential Good   Clinical Impairments Affecting Rehab Potential none   PT Frequency 2x / week   PT Duration 6 weeks   PT Treatment/Interventions Moist Heat;Therapeutic activities;Patient/family education;Passive range of motion;Therapeutic exercise;Ultrasound;Manual techniques;Neuromuscular re-education;Cryotherapy;Electrical Stimulation;Functional mobility training   PT Next Visit Plan work on back and thoracic strengthening.  Review posture, flexibility exercises, stretch abdominals   PT Home Exercise Plan continue with red t-band exercises and stretching    Consulted and Agree with Plan of Care Patient        Problem List There are no active problems to display for this patient.   NAUMANN-HOUEGNIFIO,Malachi Kinzler PTA 05/08/2014, 1:30 PM  [facility] [address], [address] [place], [place], [address] Phone: [phone]   Fax:  [phone]

## 2014-05-10 ENCOUNTER — Encounter: Payer: Self-pay | Admitting: Physical Therapy

## 2014-05-10 ENCOUNTER — Ambulatory Visit: Payer: BC Managed Care – PPO | Admitting: Physical Therapy

## 2014-05-10 DIAGNOSIS — R293 Abnormal posture: Secondary | ICD-10-CM

## 2014-05-10 DIAGNOSIS — M6281 Muscle weakness (generalized): Secondary | ICD-10-CM | POA: Diagnosis not present

## 2014-05-10 NOTE — Therapy (Signed)
[facility] [address], [address] [place], [place], [address] Phone: [phone]   Fax:  [phone]  Physical Therapy Treatment  Patient Details  Name: Charles Winters MRN: [id number] Date of Birth: 13-Jul-1950 Referring Provider:  Talmage CoinKerr, Jeffrey, MD  Encounter Date: 05/10/2014      PT End of Session - 05/10/14 1154    Visit Number 5   Date for PT Re-Evaluation 05/24/14   PT Start Time 1148   PT Stop Time 1230   PT Time Calculation (min) 42 min   Activity Tolerance Patient tolerated treatment well   Behavior During Therapy [facility] for tasks assessed/performed      History reviewed. No pertinent past medical history.  History reviewed. No pertinent past surgical history.  There were no vitals filed for this visit.  Visit Diagnosis:  Weakness of trunk musculature  Abnormal posture                     [facility] Adult PT Treatment/Exercise - 05/10/14 0001    Neck Exercises: Prone   Neck Retraction 20 reps  on Foam Roll with small towel under head    Lumbar Exercises: Aerobic   UBE (Upper Arm Bike) L3 6(3/3)  sitting on green physio ball   Lumbar Exercises: Seated   Other Seated Lumbar Exercises Foam roll rolling forward/back in supine for thoracic flex  pt has Foam roller at home   Lumbar Exercises: Supine   Other Supine Lumbar Exercises Foam roll red t-band ABD, D2 & constant pull into overhead flexion, unilat LE lift, opposit UE/LE lift 2 x 10 each   Lumbar Exercises: Prone   Straight Leg Raise 10 reps;3 seconds   Other Prone Lumbar Exercises Prone on forearms 2min                  PT Short Term Goals - 05/08/14 1232    PT SHORT TERM GOAL #1   Title understand correct body mechanics with home and gardening tasks to decreased strain on spine   Time 3   Period Weeks   Status Achieved   PT SHORT TERM GOAL #2   Title fatique in back muscles with vacuuming and gardening decreased >/= 25%   Time 3   Period Weeks   Status On-going   PT SHORT TERM GOAL #3   Title while performing upright acitivites breathing improved >/= 25% due to increased trunk extension   Time 3   Period Weeks   Status On-going   PT SHORT TERM GOAL #4   Title pain with upright activities reduced >/= 25% due to increased trunk strength   Time 3   Period Weeks   Status On-going           PT Long Term Goals - 05/08/14 1233    PT LONG TERM GOAL #1   Title understand how to exercise at the gym correctly and increased trunk strength  Pt ordered faom roll for home use   Time 6   Period Weeks   Status On-going   PT LONG TERM GOAL #2   Title fatique in back muscle decreased >/= 75% due to increased trunk strength   Time 6   Period Weeks   Status On-going   PT LONG TERM GOAL #3   Title while performing upright activities breathing improved by 75% due to increased trunk extension   Time 6   Period Weeks   Status On-going   PT LONG TERM  GOAL #4   Title pain with upright activities improved >/= 60% due to increased strength   Time 6   Period Weeks   Status On-going               Plan - 05/10/14 1158    Clinical Impression Statement Pt reports improved awarness with posture   Pt will benefit from skilled therapeutic intervention in order to improve on the following deficits Decreased range of motion;Difficulty walking;Impaired flexibility;Improper body mechanics;Postural dysfunction;Decreased endurance;Decreased activity tolerance;Increased fascial restricitons;Pain;Increased muscle spasms;Decreased mobility;Decreased strength   Rehab Potential Good   PT Frequency 2x / week   PT Duration 8 weeks   PT Treatment/Interventions Moist Heat;Therapeutic activities;Patient/family education;Passive range of motion;Therapeutic exercise;Ultrasound;Manual techniques;Neuromuscular re-education;Cryotherapy;Electrical Stimulation;Functional mobility training   PT Next Visit Plan work on back and thoracic  strengthening.  Review posture, flexibility exercises, stretch abdominals   PT Home Exercise Plan continue with red t-band exercises and stretching    Consulted and Agree with Plan of Care Patient        Problem List There are no active problems to display for this patient.   NAUMANN-HOUEGNIFIO,Jaimee Corum PTA 05/10/2014, 1:46 PM  [facility] [address], [address] [place], [place], [address] Phone: [phone]   Fax:  [phone]

## 2014-05-15 ENCOUNTER — Ambulatory Visit: Payer: BC Managed Care – PPO | Admitting: Physical Therapy

## 2014-05-15 ENCOUNTER — Encounter: Payer: Self-pay | Admitting: Physical Therapy

## 2014-05-15 DIAGNOSIS — M6281 Muscle weakness (generalized): Secondary | ICD-10-CM | POA: Diagnosis not present

## 2014-05-15 DIAGNOSIS — R293 Abnormal posture: Secondary | ICD-10-CM

## 2014-05-15 NOTE — Therapy (Signed)
[facility] [address], [address] [place], [place], [address] Phone: [phone]   Fax:  [phone]  Physical Therapy Treatment  Patient Details  Name: Charles Winters MRN: [id number] Date of Birth: 01-May-1950 Referring Provider:  Talmage CoinKerr, Jeffrey, MD  Encounter Date: 05/15/2014      PT End of Session - 05/15/14 1203    Visit Number 6   Date for PT Re-Evaluation 05/24/14   PT Start Time 1147   PT Stop Time 1230   PT Time Calculation (min) 43 min   Activity Tolerance Patient tolerated treatment well   Behavior During Therapy [facility] for tasks assessed/performed      History reviewed. No pertinent past medical history.  History reviewed. No pertinent past surgical history.  There were no vitals filed for this visit.  Visit Diagnosis:  Weakness of trunk musculature  Abnormal posture                     [facility] Adult PT Treatment/Exercise - 05/15/14 0001    Neck Exercises: Prone   Neck Retraction 20 reps  on Foam Roll with small towel under head    Lumbar Exercises: Aerobic   UBE (Upper Arm Bike) L3 6(3/3)  sitting on green physioball   Lumbar Exercises: Seated   Other Seated Lumbar Exercises Foam roll rolling forward/back in supine for thoracic flex x10  pr with good technique   Other Seated Lumbar Exercises Feet on Foam roll lifting into bridging x 10   Lumbar Exercises: Supine   Bridge 20 reps   Other Supine Lumbar Exercises Foam roll, marching, unil. UE and LE lift 2 x10   Other Supine Lumbar Exercises Foam roll red t-band ABD, D2 & constant pull into overhead flexion, unilat LE lift, opposit UE/LE lift 2 x 10 each   Lumbar Exercises: Prone   Other Prone Lumbar Exercises Plank position x5 with 10 sec hold                  PT Short Term Goals - 05/15/14 1206    PT SHORT TERM GOAL #1   Title understand correct body mechanics with home and gardening tasks to decreased strain on spine   Time 3   Period Weeks   Status Achieved   PT SHORT TERM GOAL #2   Title fatique in back muscles with vacuuming and gardening decreased >/= 25%   Time 3   Period Weeks   Status On-going   PT SHORT TERM GOAL #3   Title while performing upright acitivites breathing improved >/= 25% due to increased trunk extension   Time 3   Period Weeks   Status Achieved   PT SHORT TERM GOAL #4   Title pain with upright activities reduced >/= 25% due to increased trunk strength  20% stronger   Time 3   Period Weeks           PT Long Term Goals - 05/15/14 1210    PT LONG TERM GOAL #1   Title understand how to exercise at the gym correctly and increased trunk strength   Time 6   Period Weeks   Status On-going   PT LONG TERM GOAL #2   Title fatique in back muscle decreased >/= 75% due to increased trunk strength   Time 6   Period Weeks   Status On-going   PT LONG TERM GOAL #3   Title while performing upright activities breathing improved by 75% due to  increased trunk extension   Time 6   Period Weeks   Status On-going   PT LONG TERM GOAL #4   Title pain with upright activities improved >/= 60% due to increased strength   Time 6   Period Weeks   Status On-going               Plan - 05/15/14 1203    Clinical Impression Statement Pt continues to improve with posture awarness, and presents with improved trunk extension   Pt will benefit from skilled therapeutic intervention in order to improve on the following deficits Decreased range of motion;Difficulty walking;Impaired flexibility;Improper body mechanics;Postural dysfunction;Decreased endurance;Decreased activity tolerance;Increased fascial restricitons;Pain;Increased muscle spasms;Decreased mobility;Decreased strength   PT Frequency 2x / week   PT Duration 8 weeks   PT Treatment/Interventions Moist Heat;Therapeutic activities;Patient/family education;Passive range of motion;Therapeutic exercise;Ultrasound;Manual  techniques;Neuromuscular re-education;Cryotherapy;Electrical Stimulation;Functional mobility training   PT Next Visit Plan work on back and thoracic strengthening.  Review posture, flexibility exercises, stretch abdominals   PT Home Exercise Plan continue with red t-band exercises and stretching    Consulted and Agree with Plan of Care Patient        Problem List There are no active problems to display for this patient.   NAUMANN-HOUEGNIFIO,[name] PTA 05/15/2014, 1:38 PM  [facility] [address], [address] [place], [place], [address] Phone: [phone]   Fax:  [phone]

## 2014-05-17 ENCOUNTER — Encounter: Payer: Self-pay | Admitting: Physical Therapy

## 2014-05-17 ENCOUNTER — Ambulatory Visit: Payer: BC Managed Care – PPO | Admitting: Physical Therapy

## 2014-05-17 DIAGNOSIS — M6281 Muscle weakness (generalized): Secondary | ICD-10-CM

## 2014-05-17 DIAGNOSIS — R293 Abnormal posture: Secondary | ICD-10-CM

## 2014-05-17 NOTE — Therapy (Signed)
[facility] [address] [place], [place], [address] Phone: [phone]   Fax:  [phone]  Physical Therapy Treatment  Patient Details  Name: Charles Winters MRN: [id number] Date of Birth: 10-13-1950 Referring Provider:  Talmage Coin, MD  Encounter Date: 05/17/2014      PT End of Session - 05/17/14 1219    Visit Number 7   Date for PT Re-Evaluation 05/24/14   PT Start Time 1146   PT Stop Time 1230   PT Time Calculation (min) 44 min   Activity Tolerance Patient tolerated treatment well   Behavior During Therapy [facility] for tasks assessed/performed      History reviewed. No pertinent past medical history.  History reviewed. No pertinent past surgical history.  There were no vitals filed for this visit.  Visit Diagnosis:  Weakness of trunk musculature  Abnormal posture      Subjective Assessment - 05/17/14 1155    Subjective Pt had prolonged car ride yesterday and prolonged sitting periods and mfeels increased stiffness today   Limitations Sitting;Lifting;Standing;Walking   How long can you stand comfortably? 10 min then has to sit down   How long can you walk comfortably? no difficulty   Patient Stated Goals decreased discomfort in back due to increased strength of back    Currently in Pain? No/denies   Multiple Pain Sites No                       [facility] Adult PT Treatment/Exercise - 05/17/14 0001    Exercises   Exercises Shoulder   Neck Exercises: Theraband   Shoulder Extension 20 reps;Red  bil for back strength, with focus on core stability   Rows 20 reps;Red  with focus on core stability   Neck Exercises: Prone   Neck Retraction 20 reps  on Foam Roll with small towel under head    Lumbar Exercises: Aerobic   UBE (Upper Arm Bike) L3 6(3/3)  sitting on green physioball   Lumbar Exercises: Supine   Other Supine Lumbar Exercises Foam roll, marching, unil. UE and LE lift 2 x10   Other Supine Lumbar Exercises Thoracic self mob in supine and sitting with towel roll                 PT Education - 05/17/14 1217    Education provided Yes   Education Details thoracic self mob in sitting and supine   Person(s) Educated Patient   Methods Explanation;Tactile cues;Verbal cues;Handout   Comprehension Verbalized understanding;Returned demonstration          PT Short Term Goals - 05/15/14 1206    PT SHORT TERM GOAL #1   Title understand correct body mechanics with home and gardening tasks to decreased strain on spine   Time 3   Period Weeks   Status Achieved   PT SHORT TERM GOAL #2   Title fatique in back muscles with vacuuming and gardening decreased >/= 25%   Time 3   Period Weeks   Status On-going   PT SHORT TERM GOAL #3   Title while performing upright acitivites breathing improved >/= 25% due to increased trunk extension   Time 3   Period Weeks   Status Achieved   PT SHORT TERM GOAL #4   Title pain with upright activities reduced >/= 25% due to increased trunk strength  20% stronger   Time 3   Period Weeks  PT Long Term Goals - 05/15/14 1210    PT LONG TERM GOAL #1   Title understand how to exercise at the gym correctly and increased trunk strength   Time 6   Period Weeks   Status On-going   PT LONG TERM GOAL #2   Title fatique in back muscle decreased >/= 75% due to increased trunk strength   Time 6   Period Weeks   Status On-going   PT LONG TERM GOAL #3   Title while performing upright activities breathing improved by 75% due to increased trunk extension   Time 6   Period Weeks   Status On-going   PT LONG TERM GOAL #4   Title pain with upright activities improved >/= 60% due to increased strength   Time 6   Period Weeks   Status On-going               Plan - 05/17/14 1224    Clinical Impression Statement Pt with improved posture awarness as noticed in impproved trunk extension   Rehab Potential Good    Clinical Impairments Affecting Rehab Potential none   PT Frequency 2x / week   PT Duration 8 weeks   PT Treatment/Interventions Moist Heat;Therapeutic activities;Patient/family education;Passive range of motion;Therapeutic exercise;Ultrasound;Manual techniques;Neuromuscular re-education;Cryotherapy;Electrical Stimulation;Functional mobility training   PT Next Visit Plan work on strengthening with t-band red   PT Home Exercise Plan review self mob with towel roll   Consulted and Agree with Plan of Care Patient        Problem List There are no active problems to display for this patient.   NAUMANN-HOUEGNIFIO,Calie Buttrey PTA 05/17/2014, 12:33 PM  [facility] [address], [address] [place], [place], [address] Phone: [phone]   Fax:  [phone]

## 2014-05-17 NOTE — Patient Instructions (Addendum)
Thoracic Self-Mobilization (Sitting)   With small rolled towel at lower ribs level, gently lean back until stretch is felt. Hold  20 seconds. Relax. Repeat  3 times per set.   Do 3 sessions per day.  http://orth.exer.us/998   Copyright  VHI. All rights reserved.  Thoracic Self-Mobilization Stretch (Supine)  With towel roll at mid thoracic (bra area), gently lie back until stretch is felt. Hold 3 min Relax. Repeat  1 times per set.  Do  2 sessions per day.  http://orth.exer.us/994   Copyright  VHI. All rights reserved.

## 2014-05-22 ENCOUNTER — Ambulatory Visit: Payer: BC Managed Care – PPO | Admitting: Physical Therapy

## 2014-05-22 ENCOUNTER — Encounter: Payer: Self-pay | Admitting: Physical Therapy

## 2014-05-22 DIAGNOSIS — R293 Abnormal posture: Secondary | ICD-10-CM

## 2014-05-22 DIAGNOSIS — M6281 Muscle weakness (generalized): Secondary | ICD-10-CM

## 2014-05-22 NOTE — Patient Instructions (Signed)
Cat Back   On hands and knees,  CAT exhale and round back up (move the spine out) CAMEL Inhale and arch back down (move spine in) Perform 3 x 10 no major holds go with your breathing Copyright  VHI. All rights reserved.

## 2014-05-22 NOTE — Therapy (Signed)
[facility] [address], [address] [place], [place], [address] Phone: [phone]   Fax:  [phone]  Physical Therapy Treatment  Patient Details  Name: Charles Winters MRN: [id number] Date of Birth: March 28, 1950 Referring Provider:  Talmage Coin, MD  Encounter Date: 05/22/2014      PT End of Session - 05/22/14 1320    Visit Number 8   Date for PT Re-Evaluation 05/24/14   PT Start Time 1147   PT Stop Time 1230   PT Time Calculation (min) 43 min   Activity Tolerance Patient tolerated treatment well   Behavior During Therapy [facility] for tasks assessed/performed      History reviewed. No pertinent past medical history.  History reviewed. No pertinent past surgical history.  There were no vitals filed for this visit.  Visit Diagnosis:  Weakness of trunk musculature  Abnormal posture      Subjective Assessment - 05/22/14 1315    Subjective Pt reports he tolerated prolonged garden work this week end and he does not more notices no more breathing restrictions     Limitations Standing;Walking;Lifting   Patient Stated Goals decreased discomfort in back due to increased strength of back    Currently in Pain? No/denies   Multiple Pain Sites No                         [facility] Adult PT Treatment/Exercise - 05/22/14 0001    Neck Exercises: Theraband   Shoulder Extension 20 reps;10 reps;Red   Rows 20 reps;Red;10 reps  with focus on core stability   Lumbar Exercises: Aerobic   UBE (Upper Arm Bike) L3 6(3/3)  sitting on green physioball   Lumbar Exercises: Supine   Other Supine Lumbar Exercises Thoracic self mob in supine and sitting with towel roll    Lumbar Exercises: Prone   Other Prone Lumbar Exercises Plank position x5 with 20 sec hold   Lumbar Exercises: Quadruped   Madcat/Old Horse 20 reps  quadruped and sitting   Shoulder Exercises: Stretch   Corner Stretch 3 reps;20 seconds;Other (comment)  on  doorframe                PT Education - 05/22/14 1208    Education provided Yes   Education Details Cat and Camel in sitting and quadrupped   Person(s) Educated Patient   Methods Explanation;Demonstration;Handout   Comprehension Verbalized understanding;Returned demonstration          PT Short Term Goals - 05/15/14 1206    PT SHORT TERM GOAL #1   Title understand correct body mechanics with home and gardening tasks to decreased strain on spine   Time 3   Period Weeks   Status Achieved   PT SHORT TERM GOAL #2   Title fatique in back muscles with vacuuming and gardening decreased >/= 25%   Time 3   Period Weeks   Status On-going   PT SHORT TERM GOAL #3   Title while performing upright acitivites breathing improved >/= 25% due to increased trunk extension   Time 3   Period Weeks   Status Achieved   PT SHORT TERM GOAL #4   Title pain with upright activities reduced >/= 25% due to increased trunk strength  20% stronger   Time 3   Period Weeks           PT Long Term Goals - 05/22/14 1322    PT LONG TERM GOAL #1   Title  understand how to exercise at the gym correctly and increased trunk strength   Time 6   Period Weeks   Status Achieved   PT LONG TERM GOAL #2   Title fatique in back muscle decreased >/= 75% due to increased trunk strength   Time 6   Period Weeks   Status On-going   PT LONG TERM GOAL #3   Title while performing upright activities breathing improved by 75% due to increased trunk extension   Time 6   Period Weeks   Status Achieved   PT LONG TERM GOAL #4   Title pain with upright activities improved >/= 60% due to increased strength   Time 6   Period Weeks   Status On-going               Plan - 05/22/14 1320    Clinical Impression Statement Pt with improved posture awarness and improved trunk extension   Pt will benefit from skilled therapeutic intervention in order to improve on the following deficits Decreased range of  motion;Difficulty walking;Impaired flexibility;Improper body mechanics;Postural dysfunction;Decreased endurance;Decreased activity tolerance;Increased fascial restricitons;Pain;Increased muscle spasms;Decreased mobility;Decreased strength   Rehab Potential Good   Clinical Impairments Affecting Rehab Potential none   PT Frequency 2x / week   PT Duration 8 weeks   PT Next Visit Plan D/C to HEP   PT Home Exercise Plan review self mob towel roll and cat and camel   Recommended Other Services none   Consulted and Agree with Plan of Care Patient        Problem List There are no active problems to display for this patient.   NAUMANN-HOUEGNIFIO,Chatara Lucente PTA 05/22/2014, 1:23 PM  [facility] [address], [address] [place], [place], [address] Phone: [phone]   Fax:  [phone]

## 2014-05-24 ENCOUNTER — Ambulatory Visit: Payer: BC Managed Care – PPO

## 2014-05-24 DIAGNOSIS — M6281 Muscle weakness (generalized): Secondary | ICD-10-CM

## 2014-05-24 DIAGNOSIS — R293 Abnormal posture: Secondary | ICD-10-CM

## 2014-05-24 NOTE — Patient Instructions (Signed)
Extension   Lift leg up in the air and bring it back down. Repeat with other leg. Repeat _2x10___ times. Do _2-3___ sessions per day.  http://gt2.exer.us/387   Copyright  VHI. All rights reserved.

## 2014-05-24 NOTE — Therapy (Signed)
[facility] [address], [place], [place], [address] Phone: [phone]   Fax:  [phone]  Physical Therapy Treatment  Patient Details  Name: Charles Winters MRN: [id number] Date of Birth: 07-14-1950 Referring Provider:  Delrae Rend, MD  Encounter Date: 05/24/2014      PT End of Session - 05/24/14 1244    Visit Number 9   PT Start Time 7416   PT Stop Time 1226   PT Time Calculation (min) 41 min   Activity Tolerance Patient tolerated treatment well   Behavior During Therapy [facility] for tasks assessed/performed      History reviewed. No pertinent past medical history.  History reviewed. No pertinent past surgical history.  There were no vitals filed for this visit.  Visit Diagnosis:  Weakness of trunk musculature  Abnormal posture      Subjective Assessment - 05/24/14 1150    Subjective Ready for D/C to HEP   Currently in Pain? No/denies   Pain Score 0-No pain   Pain Location Back            [facility] PT Assessment - 05/24/14 0001    Assessment   Medical Diagnosis DDD of spine; loss of height; kyphosis of thoracic spine; mid back pain   Onset Date 02/04/12   Observation/Other Assessments   Focus on Therapeutic Outcomes (FOTO)  41% limitation                     [facility] Adult PT Treatment/Exercise - 05/24/14 0001    Neck Exercises: Theraband   Shoulder Extension 20 reps;10 reps;Green  issued green band for HEP   Rows 20 reps;10 reps;Green  with focus on core stability   Horizontal ABduction 20 reps;Green  on foam roll.  Also performed D2   Lumbar Exercises: Aerobic   UBE (Upper Arm Bike) L3 6(3/3)  PT present to discuss progress   Lumbar Exercises: Prone   Straight Leg Raises Limitations 2x10                PT Education - 05/24/14 1220    Education provided Yes   Education Details prone hip extension   Person(s) Educated Patient   Methods  Explanation;Demonstration;Handout   Comprehension Verbalized understanding;Returned demonstration          PT Short Term Goals - 05/15/14 1206    PT SHORT TERM GOAL #1   Title understand correct body mechanics with home and gardening tasks to decreased strain on spine   Time 3   Period Weeks   Status Achieved   PT SHORT TERM GOAL #2   Title fatique in back muscles with vacuuming and gardening decreased >/= 25%   Time 3   Period Weeks   Status On-going   PT SHORT TERM GOAL #3   Title while performing upright acitivites breathing improved >/= 25% due to increased trunk extension   Time 3   Period Weeks   Status Achieved   PT SHORT TERM GOAL #4   Title pain with upright activities reduced >/= 25% due to increased trunk strength  20% stronger   Time 3   Period Weeks           PT Long Term Goals - 05/24/14 1154    PT LONG TERM GOAL #1   Title understand how to exercise at the gym correctly and increased trunk strength   Status Achieved   PT LONG TERM GOAL #2   Title fatique in  back muscle decreased >/= 75% due to increased trunk strength   Status Partially Met  50% better   PT LONG TERM GOAL #3   Title while performing upright activities breathing improved by 75% due to increased trunk extension   Status Achieved   PT LONG TERM GOAL #4   Title pain with upright activities improved >/= 60% due to increased strength   Status Achieved  Pt denies any pain unless static standing.                Plan - 05/24/14 1210    Clinical Impression Statement Pt has met all goals and will D/C to HEP.  See goals for status update.     PT Next Visit Plan D/C PT to HEP   Consulted and Agree with Plan of Care Patient        Problem List There are no active problems to display for this patient. PHYSICAL THERAPY DISCHARGE SUMMARY  Visits from Start of Care: 9  Current functional level related to goals / functional outcomes: See goals for status.  Pt reports 50% overall  improvement in symptoms.    Remaining deficits: Pt with continued chronic postural abnormality.  Pt is correcting this posture and has HEP in place to address posture, flexibility and strength.    Education / Equipment: HEP, body mechanics/posture education Plan: Patient agrees to discharge.  Patient goals were met. Patient is being discharged due to meeting the stated rehab goals.  ?????      Burlie Cajamarca, PT  05/24/2014, 12:45 PM  [facility] [address] [place], [place], [address] Phone: [phone]   Fax:  [phone]

## 2014-05-29 ENCOUNTER — Encounter: Payer: BC Managed Care – PPO | Admitting: Physical Therapy

## 2014-05-31 ENCOUNTER — Encounter: Payer: BC Managed Care – PPO | Admitting: Physical Therapy

## 2014-07-28 ENCOUNTER — Ambulatory Visit (INDEPENDENT_AMBULATORY_CARE_PROVIDER_SITE_OTHER): Payer: BC Managed Care – PPO | Admitting: Podiatry

## 2014-07-28 ENCOUNTER — Ambulatory Visit: Payer: Self-pay

## 2014-07-28 ENCOUNTER — Encounter: Payer: Self-pay | Admitting: Podiatry

## 2014-07-28 VITALS — BP 124/71 | HR 61 | Resp 15

## 2014-07-28 DIAGNOSIS — B079 Viral wart, unspecified: Secondary | ICD-10-CM

## 2014-07-28 DIAGNOSIS — L723 Sebaceous cyst: Secondary | ICD-10-CM | POA: Diagnosis not present

## 2014-07-28 DIAGNOSIS — B078 Other viral warts: Secondary | ICD-10-CM

## 2014-07-28 DIAGNOSIS — M722 Plantar fascial fibromatosis: Secondary | ICD-10-CM | POA: Diagnosis not present

## 2014-07-28 DIAGNOSIS — M79671 Pain in right foot: Secondary | ICD-10-CM

## 2014-07-28 MED ORDER — TRIAMCINOLONE ACETONIDE 10 MG/ML IJ SUSP
10.0000 mg | Freq: Once | INTRAMUSCULAR | Status: AC
  Administered 2014-07-28: 10 mg

## 2014-07-28 NOTE — Progress Notes (Signed)
   Subjective:    Patient ID: Charles Winters, male    DOB: 1950-11-12, 64 y.o.   MRN: [id number]  HPI Pt presents with painful knot on the bottom of his right heel that he thinks is a plantar wart, he also c/o intermittent pain of right 3rd toe, and discoloration of right great toe   Review of Systems  All other systems reviewed and are negative.      Objective:   Physical Exam        Assessment & Plan:

## 2014-07-29 NOTE — Progress Notes (Signed)
Subjective:     Patient ID: Charles Winters, male   DOB: [birth year], 65 y.o.   MRN: [id number]  HPI patient presents with a painful not on the bottom of the right heel that he thinks might be a plantar's wart with no history of injury and also intermittent pain in the right third toe and occasional discoloration of the right big toe   Review of Systems  All other systems reviewed and are negative.      Objective:   Physical Exam  Constitutional: He is oriented to person, place, and time.  Cardiovascular: Intact distal pulses.   Musculoskeletal: Normal range of motion.  Neurological: He is oriented to person, place, and time.  Skin: Skin is warm.  Nursing note and vitals reviewed.  neurovascular status intact muscle strength adequate with range of motion subtalar midtarsal joint within normal limits. Patient's noted to have a lesion on the plantar aspect right heel with some what appears to be subcutaneous involvement but discoloration on the surface. It is quite a bit painful when pressed from a plantar direction. 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 Today I did a plantar injection of the right heel 3 mg Kenalog 5 mill grams Xylocaine to reduce possible cyst formation and reduce plantar fascial symptomatology I then debrided the lesion found a small amount of painful tissue with pinpoint bleeding and applied chemical agent to the skin with sterile dressing and instructed on what to do if it should blister. We will leave the other 2 areas alone for now and reappoint in 3 weeks

## 2014-08-17 ENCOUNTER — Ambulatory Visit (INDEPENDENT_AMBULATORY_CARE_PROVIDER_SITE_OTHER): Payer: BC Managed Care – PPO | Admitting: Podiatry

## 2014-08-17 ENCOUNTER — Encounter: Payer: Self-pay | Admitting: Podiatry

## 2014-08-17 VITALS — BP 99/60 | HR 56 | Resp 12

## 2014-08-17 DIAGNOSIS — B078 Other viral warts: Secondary | ICD-10-CM

## 2014-08-17 DIAGNOSIS — B070 Plantar wart: Secondary | ICD-10-CM | POA: Diagnosis not present

## 2014-08-17 DIAGNOSIS — M722 Plantar fascial fibromatosis: Secondary | ICD-10-CM

## 2014-08-17 DIAGNOSIS — B079 Viral wart, unspecified: Secondary | ICD-10-CM

## 2014-08-17 NOTE — Progress Notes (Signed)
   Subjective:    Patient ID: Charles CarrowJames C Dubose, male    DOB: Sep 17, 1950, 64 y.o.   MRN: [id number]  HPI Patient was last seen for Plantar wart on R foot. The site no longer gives patient pain but it can be vary in texture day to day. Patient also wants to know if anything can be done about the mark.   Review of Systems     Objective:   Physical Exam        Assessment & Plan:

## 2014-08-18 NOTE — Progress Notes (Signed)
Subjective:     Patient ID: Charles Winters, male   DOB: 1950/02/27, 64 y.o.   MRN: [id number]  HPI patient states it seems some improved from previous but it was quite painful for several days afterwards   Review of Systems     Objective:   Physical Exam Neurovascular status intact muscle strength adequate with discomfort in the plantar right heel with a lesion formation approximate 1 cm x 1 cm that upon debridement shows pinpoint bleeding and appears to be within the skin. There may be a small nodule under it but it's difficult to determine at this time    Assessment:     Verruca plantaris right that's improving with possibility for underlying nodule    Plan:     Continue to work on the surface at this time and I debrided tissue and applied immune agent in order to create chemical reaction and kill wart tissue. Reappoint if symptomatic and instructed him what to watch out for

## 2015-03-14 ENCOUNTER — Other Ambulatory Visit: Payer: Self-pay | Admitting: Geriatric Medicine

## 2015-03-14 DIAGNOSIS — E042 Nontoxic multinodular goiter: Secondary | ICD-10-CM

## 2015-03-20 ENCOUNTER — Ambulatory Visit
Admission: RE | Admit: 2015-03-20 | Discharge: 2015-03-20 | Disposition: A | Discharge status: Home / Self Care | Payer: BC Managed Care – PPO | Source: Ambulatory Visit | Attending: Geriatric Medicine | Admitting: Geriatric Medicine

## 2015-03-20 DIAGNOSIS — E042 Nontoxic multinodular goiter: Secondary | ICD-10-CM

## 2016-03-31 ENCOUNTER — Other Ambulatory Visit: Payer: Self-pay | Admitting: Geriatric Medicine

## 2016-03-31 DIAGNOSIS — E041 Nontoxic single thyroid nodule: Secondary | ICD-10-CM

## 2016-04-03 ENCOUNTER — Ambulatory Visit
Admission: RE | Admit: 2016-04-03 | Discharge: 2016-04-03 | Disposition: A | Discharge status: Home / Self Care | Payer: Medicare Other | Source: Ambulatory Visit | Attending: Geriatric Medicine | Admitting: Geriatric Medicine

## 2016-04-03 DIAGNOSIS — E041 Nontoxic single thyroid nodule: Secondary | ICD-10-CM

## 2018-07-06 ENCOUNTER — Ambulatory Visit
Admission: RE | Admit: 2018-07-06 | Discharge: 2018-07-06 | Disposition: A | Discharge status: Home / Self Care | Payer: Medicare Other | Source: Ambulatory Visit | Attending: Geriatric Medicine | Admitting: Geriatric Medicine

## 2018-07-06 ENCOUNTER — Other Ambulatory Visit: Payer: Self-pay | Admitting: Geriatric Medicine

## 2018-07-06 DIAGNOSIS — M79605 Pain in left leg: Secondary | ICD-10-CM

## 2018-09-23 ENCOUNTER — Other Ambulatory Visit: Payer: Self-pay | Admitting: Geriatric Medicine

## 2018-09-23 ENCOUNTER — Ambulatory Visit
Admission: RE | Admit: 2018-09-23 | Discharge: 2018-09-23 | Disposition: A | Discharge status: Home / Self Care | Payer: Medicare Other | Source: Ambulatory Visit | Attending: Geriatric Medicine | Admitting: Geriatric Medicine

## 2018-09-23 DIAGNOSIS — R0981 Nasal congestion: Secondary | ICD-10-CM

## 2018-09-28 ENCOUNTER — Other Ambulatory Visit: Payer: Self-pay | Admitting: Geriatric Medicine

## 2018-09-28 DIAGNOSIS — R269 Unspecified abnormalities of gait and mobility: Secondary | ICD-10-CM

## 2018-10-17 ENCOUNTER — Other Ambulatory Visit: Payer: Self-pay

## 2018-10-17 ENCOUNTER — Ambulatory Visit
Admission: RE | Admit: 2018-10-17 | Discharge: 2018-10-17 | Disposition: A | Discharge status: Home / Self Care | Payer: Medicare Other | Source: Ambulatory Visit | Attending: Geriatric Medicine | Admitting: Geriatric Medicine

## 2018-10-17 ENCOUNTER — Other Ambulatory Visit: Payer: Medicare Other

## 2018-10-17 DIAGNOSIS — R269 Unspecified abnormalities of gait and mobility: Secondary | ICD-10-CM

## 2018-10-17 MED ORDER — GADOBENATE DIMEGLUMINE 529 MG/ML IV SOLN
13.0000 mL | Freq: Once | INTRAVENOUS | Status: AC | PRN
  Administered 2018-10-17: 13 mL via INTRAVENOUS

## 2019-08-09 ENCOUNTER — Other Ambulatory Visit: Payer: Self-pay

## 2019-08-09 DIAGNOSIS — Z8249 Family history of ischemic heart disease and other diseases of the circulatory system: Secondary | ICD-10-CM

## 2019-08-25 ENCOUNTER — Other Ambulatory Visit: Payer: Self-pay

## 2019-08-25 ENCOUNTER — Ambulatory Visit (INDEPENDENT_AMBULATORY_CARE_PROVIDER_SITE_OTHER)
Admission: RE | Admit: 2019-08-25 | Discharge: 2019-08-25 | Disposition: A | Discharge status: Home / Self Care | Payer: Self-pay | Source: Ambulatory Visit | Attending: Cardiovascular Disease | Admitting: Cardiovascular Disease

## 2019-08-25 DIAGNOSIS — Z8249 Family history of ischemic heart disease and other diseases of the circulatory system: Secondary | ICD-10-CM

## 2019-09-24 DIAGNOSIS — Z20828 Contact with and (suspected) exposure to other viral communicable diseases: Secondary | ICD-10-CM | POA: Diagnosis not present

## 2019-09-28 DIAGNOSIS — H353132 Nonexudative age-related macular degeneration, bilateral, intermediate dry stage: Secondary | ICD-10-CM | POA: Diagnosis not present

## 2019-09-28 DIAGNOSIS — H401211 Low-tension glaucoma, right eye, mild stage: Secondary | ICD-10-CM | POA: Diagnosis not present

## 2019-09-28 DIAGNOSIS — H25813 Combined forms of age-related cataract, bilateral: Secondary | ICD-10-CM | POA: Diagnosis not present

## 2019-09-28 DIAGNOSIS — H401224 Low-tension glaucoma, left eye, indeterminate stage: Secondary | ICD-10-CM | POA: Diagnosis not present

## 2019-10-05 DIAGNOSIS — H2513 Age-related nuclear cataract, bilateral: Secondary | ICD-10-CM | POA: Diagnosis not present

## 2019-10-05 DIAGNOSIS — H3562 Retinal hemorrhage, left eye: Secondary | ICD-10-CM | POA: Diagnosis not present

## 2019-10-05 DIAGNOSIS — H401221 Low-tension glaucoma, left eye, mild stage: Secondary | ICD-10-CM | POA: Diagnosis not present

## 2019-10-05 DIAGNOSIS — H353131 Nonexudative age-related macular degeneration, bilateral, early dry stage: Secondary | ICD-10-CM | POA: Diagnosis not present

## 2019-10-11 ENCOUNTER — Ambulatory Visit: Payer: Medicare PPO | Admitting: Pulmonary Disease

## 2019-10-11 ENCOUNTER — Encounter: Payer: Self-pay | Admitting: Pulmonary Disease

## 2019-10-11 ENCOUNTER — Other Ambulatory Visit: Payer: Self-pay

## 2019-10-11 VITALS — BP 110/72 | HR 60 | Temp 98.1°F | Ht 66.0 in | Wt 138.6 lb

## 2019-10-11 DIAGNOSIS — R0600 Dyspnea, unspecified: Secondary | ICD-10-CM

## 2019-10-11 DIAGNOSIS — R911 Solitary pulmonary nodule: Secondary | ICD-10-CM

## 2019-10-11 DIAGNOSIS — R0609 Other forms of dyspnea: Secondary | ICD-10-CM

## 2019-10-11 MED ORDER — IPRATROPIUM BROMIDE HFA 17 MCG/ACT IN AERS: 2.0000 | INHALATION_SPRAY | Freq: Four times a day (QID) | RESPIRATORY_TRACT | 12 refills | Status: DC | PRN

## 2019-10-11 NOTE — Patient Instructions (Addendum)
Nice to meet you!  We will repeat a CT scan in 1 year to check on those shadows in the mid part of each lung and to check that small spot or nodule.  Use ipratropium 2 puffs before excercise and every 6 hours AS NEEDED for shortness of breath.  Come back in 3 months and we will check on the shortness of breath.

## 2019-10-12 NOTE — Progress Notes (Signed)
Patient ID: Charles Winters, male    DOB: 03-26-1950, 69 y.o.   MRN: [id number]  Chief Complaint  Patient presents with  . Consult    SOB bending over or with activity... Cough    Referring provider: Merlene Laughter, MD  HPI:  Charles Winters is a 69 year old man whom we are seeing in consultation at the request of Merlene Laughter, MD for evaluation of abnormal CT scan of the chest.  Notes from referring provider reviewed.  Patient received a coronary CT per his cardiologist given significant family history of CAD and early CAD. This demonstrated on my interpretation small patchy centrilobular nodules/consolidation in RML and larger but small similar appearing area in lingula.  In addition to scattered 4 mm nodules noted, on the right, on the left.  Also, scout film and CT scan looks a bit hyperinflated with increased AP size to my eye.  This prompted referral for further evaluation. He has no cough. He has no night sweats or fevers. No weight loss. He reports some intermittent DOE.  Not reliably reproducible but does occur from time to time especially when he bends over or when he is exercising.  He has a sensation that gas or air is trapped in his chest and has a hard time getting it out.  Upon further questioning he denies any exertional limitation due to dyspnea.  He is an avid exerciser and continues to do so without significant complaint although he does note he gets some shortness of breath from time to time when exercising.  However it sounds like he can push through this.  He is a never smoker.  No history of asthma in the past.  However, he does endorse significant seasonal allergies.  He also has sinus disease and has had surgery in the past.  He does feel like more recently the allergies seem worse as does nasal congestion.  He had PFTs performed at primary care office some years ago that was reportedly normal.  This was with and without bronchodilator per his report.  PMH: Seasonal  allergies, sinus disease Prior surgical history: Cholecystectomy, sinus surgery Family history: Asthma in his mother, CAD in father, cancer in both mother and father including lung cancer in father.  Questionaires / Pulmonary Flowsheets:   ACT:  No flowsheet data found.  MMRC: No flowsheet data found.  Epworth:  No flowsheet data found.  Tests:   FENO:  No results found for: NITRICOXIDE  PFT: No flowsheet data found.  WALK:  No flowsheet data found.  Imaging: Personally reviewed and as per EMR and discussion this note Lab Results:  CBC No results found for: WBC, RBC, HGB, HCT, PLT, MCV, MCH, MCHC, RDW, LYMPHSABS, MONOABS, EOSABS, BASOSABS  BMET No results found for: NA, K, CL, CO2, GLUCOSE, BUN, CREATININE, CALCIUM, GFRNONAA, GFRAA  BNP No results found for: BNP  ProBNP No results found for: PROBNP  Specialty Problems    None      No Known Allergies   There is no immunization history on file for this patient.  History reviewed. No pertinent past medical history.  Tobacco History: Social History   Tobacco Use  Smoking Status Never Smoker  Smokeless Tobacco Never Used   Counseling given: Not Answered   Continue to not smoke  Outpatient Encounter Medications as of 10/11/2019  Medication Sig  . acyclovir (ZOVIRAX) 200 MG capsule Take 200 mg by mouth 5 (five) times daily.  Marland Kitchen acyclovir ointment (ZOVIRAX) 5 % Apply  1 application topically every 3 (three) hours.  Marland Kitchen antiseptic oral rinse (BIOTENE) LIQD 15 mLs by Mouth Rinse route as needed for dry mouth.  . naproxen sodium (ANAPROX) 220 MG tablet Take 220 mg by mouth 2 (two) times daily with a meal.  . omeprazole (PRILOSEC) 20 MG capsule Take 20 mg by mouth daily.  . pseudoephedrine (SUDAFED) 30 MG tablet Take 30 mg by mouth every 4 (four) hours as needed for congestion.  . sildenafil (VIAGRA) 50 MG tablet Take 50 mg by mouth daily as needed for erectile dysfunction.  . celecoxib (CELEBREX) 200 MG  capsule Take 200 mg by mouth 2 (two) times daily.  Marland Kitchen doxycycline (VIBRAMYCIN) 100 MG capsule Take 100 mg by mouth daily.  . fexofenadine (ALLEGRA) 180 MG tablet Take 180 mg by mouth daily. (Patient not taking: Reported on 10/11/2019)  . ipratropium (ATROVENT HFA) 17 MCG/ACT inhaler Inhale 2 puffs into the lungs every 6 (six) hours as needed (Shortness of breath, prior to exercising).   No facility-administered encounter medications on file as of 10/11/2019.     Review of Systems  Review of Systems  No exertional chest pain.  No orthopnea or PND.  Comprehensive review of systems otherwise negative.  Physical Exam  BP 110/72 (BP Location: Left Arm, Cuff Size: Normal)   Pulse 60   Temp 98.1 F (36.7 C) (Oral)   Ht 5\' 6"  (1.676 m)   Wt 138 lb 9.6 oz (62.9 kg)   SpO2 100%   BMI 22.37 kg/m   Wt Readings from Last 5 Encounters:  10/11/19 138 lb 9.6 oz (62.9 kg)    BMI Readings from Last 5 Encounters:  10/11/19 22.37 kg/m     Physical Exam General: Well-appearing, in no acute distress Neck: No JVP appreciated, supple Eyes: EOMI, no icterus Respiratory: Clear to auscultation bilaterally, no wheeze, no crackle Cardiovascular: Regular rate and rhythm, no murmur Abdomen: Nondistended, bowel sounds present MSK: No synovitis, no joint effusions Neuro: Normal gait, no weakness Psych: Normal mood, full affect   Assessment & Plan:   Charles Winters is a 69 year old man whom we are seeing in consultation at the request of 78, MD for evaluation of abnormal CT scan of the chest.   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  Differential also includes scar from prior pneumonia (he reports none), mucus impaction from other inflammatory conditions such as asthma, or environmental insult.  No suspicion for active infection.  He has no cough.  No B symptoms.  No indication for antimicrobial  therapy.  Given his lack of cough and sputum production, hypertonic saline unlikely be very beneficial.  Discussed that we could trial hypertonic saline given his lack of symptoms at this time, decided to hold off for now.  We will repeat CT scan in 12 months to ensure no cavitation or significant changes.  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  Advised repeat CT scan in 12 months to follow these nodules.  Ordered today.  Dyspnea on exertion: Admittedly mild.  However given his atopic history and somewhat worsening nasal congestion/sinus disease per his report, do query whether he has underlying reactive airways disease/asthma.  His scout film and CT scan appear a bit hyperinflated to my eye although this may be an overcall.  He does endorse  sensation of difficulty exhaling all breath.  Will trial short acting bronchodilator with ipratropium.  Advised to use prior to exercise to see if this provides any benefit.  Return in about 3 months (around 01/10/2020).   Karren Burly, MD 10/12/2019

## 2019-10-17 DIAGNOSIS — R0609 Other forms of dyspnea: Secondary | ICD-10-CM

## 2019-10-17 DIAGNOSIS — R0600 Dyspnea, unspecified: Secondary | ICD-10-CM

## 2019-10-17 MED ORDER — ALBUTEROL SULFATE HFA 108 (90 BASE) MCG/ACT IN AERS
2.0000 | INHALATION_SPRAY | Freq: Four times a day (QID) | RESPIRATORY_TRACT | 11 refills | Status: DC | PRN
Start: 2019-10-17 — End: 2020-10-11

## 2019-10-17 NOTE — Telephone Encounter (Signed)
Dr. Judeth Horn, please advise on pt email thanks:   *-*-*This message has not been handled.*-*-*  Due to the cost of atrovent, I did not purchase this inhaler.  I would like to try albuterol instead because the pharmacist says the cost will be less since it is tier 1.

## 2019-11-01 DIAGNOSIS — H353132 Nonexudative age-related macular degeneration, bilateral, intermediate dry stage: Secondary | ICD-10-CM | POA: Diagnosis not present

## 2019-11-01 DIAGNOSIS — H35372 Puckering of macula, left eye: Secondary | ICD-10-CM | POA: Diagnosis not present

## 2019-11-01 DIAGNOSIS — H43823 Vitreomacular adhesion, bilateral: Secondary | ICD-10-CM | POA: Diagnosis not present

## 2019-11-01 DIAGNOSIS — H2513 Age-related nuclear cataract, bilateral: Secondary | ICD-10-CM | POA: Diagnosis not present

## 2019-11-30 DIAGNOSIS — H401211 Low-tension glaucoma, right eye, mild stage: Secondary | ICD-10-CM | POA: Diagnosis not present

## 2019-11-30 DIAGNOSIS — H25813 Combined forms of age-related cataract, bilateral: Secondary | ICD-10-CM | POA: Diagnosis not present

## 2019-11-30 DIAGNOSIS — H401222 Low-tension glaucoma, left eye, moderate stage: Secondary | ICD-10-CM | POA: Diagnosis not present

## 2019-12-02 DIAGNOSIS — H353131 Nonexudative age-related macular degeneration, bilateral, early dry stage: Secondary | ICD-10-CM | POA: Diagnosis not present

## 2019-12-02 DIAGNOSIS — H401221 Low-tension glaucoma, left eye, mild stage: Secondary | ICD-10-CM | POA: Diagnosis not present

## 2019-12-02 DIAGNOSIS — H2513 Age-related nuclear cataract, bilateral: Secondary | ICD-10-CM | POA: Diagnosis not present

## 2019-12-02 DIAGNOSIS — H3562 Retinal hemorrhage, left eye: Secondary | ICD-10-CM | POA: Diagnosis not present

## 2020-01-23 NOTE — Telephone Encounter (Signed)
Called spoke with patient  He is scheduled for 01/24/20 at 1630 Nothing further needed at this time.

## 2020-01-24 ENCOUNTER — Ambulatory Visit: Payer: Medicare PPO | Admitting: Pulmonary Disease

## 2020-01-24 ENCOUNTER — Encounter: Payer: Self-pay | Admitting: Pulmonary Disease

## 2020-01-24 ENCOUNTER — Other Ambulatory Visit: Payer: Self-pay

## 2020-01-24 VITALS — BP 118/72 | HR 67 | Temp 97.8°F | Ht 66.0 in | Wt 140.0 lb

## 2020-01-24 DIAGNOSIS — R059 Cough, unspecified: Secondary | ICD-10-CM

## 2020-01-24 DIAGNOSIS — J310 Chronic rhinitis: Secondary | ICD-10-CM | POA: Diagnosis not present

## 2020-01-24 DIAGNOSIS — R911 Solitary pulmonary nodule: Secondary | ICD-10-CM | POA: Diagnosis not present

## 2020-01-24 DIAGNOSIS — R0600 Dyspnea, unspecified: Secondary | ICD-10-CM

## 2020-01-24 DIAGNOSIS — R0609 Other forms of dyspnea: Secondary | ICD-10-CM

## 2020-01-24 MED ORDER — AZELASTINE HCL 0.1 % NA SOLN: 1.0000 | Freq: Two times a day (BID) | NASAL | 12 refills | Status: DC

## 2020-01-24 NOTE — Patient Instructions (Addendum)
Ok to stop albuterol  Use flonase 1 spray each nare twice a day for 7 days then back to daily  Use azelastine (new script) 1 spray each nare twice a day  Let's see if these changes improve breathing at the gym by improving nasal congestion.  Come back in 6 months or sooner with Dr. Judeth Horn. Let me know if I can help in any way.

## 2020-01-24 NOTE — Progress Notes (Signed)
Patient ID: Charles Winters, male    DOB: 11/18/1950, 69 y.o.   MRN: [id number]  Chief Complaint  Patient presents with  . Follow-up    3 month f/u. States his breathing has been stable since last visit. Has been using albuterol daily instead of occasionally. Increased nasal congestion, only when exercising.     Referring provider: Merlene Laughter, MD  HPI:  Charles Winters is a 69 year old man whom we are seeing in follow up for abnormal CT likely indolent NTM and mild DOE.  Doing okay.  Breathing about the same.  Still has mild dyspnea on exertion with bending over, strenuous yard work.  Not doing those activities much as of late with loss of Charles Winters.  Notes more bothersome symptom of severe nasal congestion proceeded with severe/strenuous exertion.  Notably when working with his personal trainer couple times a week.  Feels like his nasal passages get swollen, he produces a lot of clear rhinorrhea, has agreed to his mouth.  This causes worsening shortness of breath and sometimes he has had to stop his session early.  He is on Flonase 1 spray each nare daily.  Continue Zyrtec.  No headaches, no fever or chills.  No cough.  Trialed albuterol use.  Unsure if using it correctly.  Eventually a spacer and tried using it.  Has been using every day for the last few weeks.  No significant provement in his day-to-day mild dyspnea and no significant provement with his mild dyspnea at the gym.  HPI at initial visit: Patient received a coronary CT per his cardiologist given significant family history of CAD and early CAD. This demonstrated on my interpretation small patchy centrilobular nodules/consolidation in RML and larger but small similar appearing area in lingula.  In addition to scattered 4 mm nodules noted, on the right, on the left.  Also, scout film and CT scan looks a bit hyperinflated with increased AP size to my eye.  This prompted referral for further evaluation. He has no cough. He has no  night sweats or fevers. No weight loss. He reports some intermittent DOE.  Not reliably reproducible but does occur from time to time especially when he bends over or when he is exercising.  He has a sensation that gas or air is trapped in his chest and has a hard time getting it out.  Upon further questioning he denies any exertional limitation due to dyspnea.  He is an avid exerciser and continues to do so without significant complaint although he does note he gets some shortness of breath from time to time when exercising.  However it sounds like he can push through this.  He is a never smoker.  No history of asthma in the past.  However, he does endorse significant seasonal allergies.  He also has sinus disease and has had surgery in the past.  He does feel like more recently the allergies seem worse as does nasal congestion.  He had PFTs performed at primary care office some years ago that was reportedly normal.  This was with and without bronchodilator per his report.  PMH: Seasonal allergies, sinus disease Prior surgical history: Cholecystectomy, sinus surgery Family history: Asthma in his mother, CAD in father, cancer in both mother and father including lung cancer in father.  Questionaires / Pulmonary Flowsheets:   ACT:  No flowsheet data found.  MMRC: No flowsheet data found.  Epworth:  No flowsheet data found.  Tests:   FENO:  No  results found for: NITRICOXIDE  PFT: No flowsheet data found.  WALK:  No flowsheet data found.  Imaging: Personally reviewed and as per EMR and discussion this note Lab Results:  CBC No results found for: WBC, RBC, HGB, HCT, PLT, MCV, MCH, MCHC, RDW, LYMPHSABS, MONOABS, EOSABS, BASOSABS  BMET No results found for: NA, K, CL, CO2, GLUCOSE, BUN, CREATININE, CALCIUM, GFRNONAA, GFRAA  BNP No results found for: BNP  ProBNP No results found for: PROBNP  Specialty Problems   None     No Known Allergies  Immunization History   Administered Date(s) Administered  . PFIZER SARS-COV-2 Vaccination 03/07/2019, 03/28/2019    History reviewed. No pertinent past medical history.  Tobacco History: Social History   Tobacco Use  Smoking Status Never Smoker  Smokeless Tobacco Never Used   Counseling given: Not Answered   Continue to not smoke  Outpatient Encounter Medications as of 01/24/2020  Medication Sig  . acyclovir (ZOVIRAX) 200 MG capsule Take 200 mg by mouth 5 (five) times daily.  Marland Kitchen acyclovir ointment (ZOVIRAX) 5 % Apply 1 application topically every 3 (three) hours.  Marland Kitchen albuterol (VENTOLIN HFA) 108 (90 Base) MCG/ACT inhaler Inhale 2 puffs into the lungs every 6 (six) hours as needed for wheezing or shortness of breath.  Marland Kitchen antiseptic oral rinse (BIOTENE) LIQD 15 mLs by Mouth Rinse route as needed for dry mouth.  . naproxen sodium (ANAPROX) 220 MG tablet Take 220 mg by mouth 2 (two) times daily with a meal.  . omeprazole (PRILOSEC) 20 MG capsule Take 20 mg by mouth daily.  . pseudoephedrine (SUDAFED) 30 MG tablet Take 30 mg by mouth every 4 (four) hours as needed for congestion.  . sildenafil (VIAGRA) 50 MG tablet Take 50 mg by mouth daily as needed for erectile dysfunction.  Marland Kitchen azelastine (ASTELIN) 0.1 % nasal spray Place 1 spray into both nostrils 2 (two) times daily. Use in each nostril as directed  . [DISCONTINUED] fexofenadine (ALLEGRA) 180 MG tablet Take 180 mg by mouth daily. (Patient not taking: Reported on 10/11/2019)   No facility-administered encounter medications on file as of 01/24/2020.     ROS n/a  Physical Exam  BP 118/72   Pulse 67   Temp 97.8 F (36.6 C) (Temporal)   Ht 5\' 6"  (1.676 m)   Wt 140 lb (63.5 kg)   SpO2 98% Comment: on RA  BMI 22.60 kg/m   Wt Readings from Last 5 Encounters:  01/24/20 140 lb (63.5 kg)  10/11/19 138 lb 9.6 oz (62.9 kg)    BMI Readings from Last 5 Encounters:  01/24/20 22.60 kg/m  10/11/19 22.37 kg/m     Physical Exam General:  Well-appearing, in no acute distress Neck: No JVP appreciated, supple Eyes: EOMI, no icterus Nose: erythematous bilaterally, more edema on left compared to right Respiratory: Clear to auscultation bilaterally, no wheeze, no crackle Cardiovascular: Regular rate and rhythm, no murmur Abdomen: Nondistended, bowel sounds present MSK: No synovitis, no joint effusions Neuro: Normal gait, no weakness Psych: Normal mood, full affect   Assessment & Plan:   Charles Winters is a 69 year old man whom we are seeing in follow up for for evaluation of abnormal CT scan of the chest and 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  Differential also includes scar from prior pneumonia (he reports none), mucus impaction from other inflammatory conditions such as asthma, or  environmental insult.  No suspicion for active infection.  He has no cough.  No B symptoms.  No indication for antimicrobial therapy.  Given his lack of cough and sputum production, hypertonic saline unlikely be very beneficial.  Discussed that we could trial hypertonic saline given his lack of symptoms at this time, decided to hold off for now.  We will repeat CT scan in 12 months (10/2020) to ensure no cavitation or significant changes.  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  Advised repeat CT scan in 12 months (10/2020) to follow these nodules.  Marland Kitchen  Dyspnea on exertion: Admittedly mild. Mainly with significant exertion. However given his atopic history and somewhat worsening nasal congestion/sinus disease per his report, do query whether he has underlying reactive airways disease/asthma.  His scout film and CT scan appear a bit hyperinflated to my eye although this may be an  overcall.  Did not imrpve with short acting bronchodilators. More nasal congestion now. --azelastine and flonase BID, reduce flonase to daily after 7 days  Return in about 6 months (around 07/24/2020).   Karren Burly, MD 01/24/2020

## 2020-02-23 NOTE — Telephone Encounter (Signed)
Patient sent email   My husband would like for him and me to participate in this research study at [facility]. The purpose of the study is to evaluate the efficacy, immunogenicity and safety of Respiratory Syncytial Virus Prefusion F Subunit Vaccine in adults. Given that I sometimes have issues with nasal congestion during my exercise workouts with the trainer, do you think I should or should not participate in this study? Overall, I am in good health but the on-going nasal congestion still occurs while exercising. The only other concern that I have is that you wanted to evaluate the spot in my lung after a year which would be in September. Would getting a vaccine affect your evaluation of the spot on my lung?   Dr. Judeth Horn please advise

## 2020-02-27 DIAGNOSIS — L819 Disorder of pigmentation, unspecified: Secondary | ICD-10-CM | POA: Diagnosis not present

## 2020-02-27 DIAGNOSIS — L738 Other specified follicular disorders: Secondary | ICD-10-CM | POA: Diagnosis not present

## 2020-04-26 DIAGNOSIS — H401222 Low-tension glaucoma, left eye, moderate stage: Secondary | ICD-10-CM | POA: Diagnosis not present

## 2020-04-26 DIAGNOSIS — H401211 Low-tension glaucoma, right eye, mild stage: Secondary | ICD-10-CM | POA: Diagnosis not present

## 2020-05-09 DIAGNOSIS — H3562 Retinal hemorrhage, left eye: Secondary | ICD-10-CM | POA: Diagnosis not present

## 2020-05-09 DIAGNOSIS — H401221 Low-tension glaucoma, left eye, mild stage: Secondary | ICD-10-CM | POA: Diagnosis not present

## 2020-05-09 DIAGNOSIS — H2513 Age-related nuclear cataract, bilateral: Secondary | ICD-10-CM | POA: Diagnosis not present

## 2020-05-09 DIAGNOSIS — H353131 Nonexudative age-related macular degeneration, bilateral, early dry stage: Secondary | ICD-10-CM | POA: Diagnosis not present

## 2020-07-09 DIAGNOSIS — Z1389 Encounter for screening for other disorder: Secondary | ICD-10-CM | POA: Diagnosis not present

## 2020-07-09 DIAGNOSIS — F5101 Primary insomnia: Secondary | ICD-10-CM | POA: Diagnosis not present

## 2020-07-09 DIAGNOSIS — Z0000 Encounter for general adult medical examination without abnormal findings: Secondary | ICD-10-CM | POA: Diagnosis not present

## 2020-07-09 DIAGNOSIS — E538 Deficiency of other specified B group vitamins: Secondary | ICD-10-CM | POA: Diagnosis not present

## 2020-07-09 DIAGNOSIS — D696 Thrombocytopenia, unspecified: Secondary | ICD-10-CM | POA: Diagnosis not present

## 2020-07-09 DIAGNOSIS — Z125 Encounter for screening for malignant neoplasm of prostate: Secondary | ICD-10-CM | POA: Diagnosis not present

## 2020-07-09 DIAGNOSIS — K9089 Other intestinal malabsorption: Secondary | ICD-10-CM | POA: Diagnosis not present

## 2020-07-09 DIAGNOSIS — K219 Gastro-esophageal reflux disease without esophagitis: Secondary | ICD-10-CM | POA: Diagnosis not present

## 2020-07-09 DIAGNOSIS — Z79899 Other long term (current) drug therapy: Secondary | ICD-10-CM | POA: Diagnosis not present

## 2020-07-09 DIAGNOSIS — H409 Unspecified glaucoma: Secondary | ICD-10-CM | POA: Diagnosis not present

## 2020-08-21 DIAGNOSIS — L578 Other skin changes due to chronic exposure to nonionizing radiation: Secondary | ICD-10-CM | POA: Diagnosis not present

## 2020-08-21 DIAGNOSIS — D1801 Hemangioma of skin and subcutaneous tissue: Secondary | ICD-10-CM | POA: Diagnosis not present

## 2020-08-21 DIAGNOSIS — L819 Disorder of pigmentation, unspecified: Secondary | ICD-10-CM | POA: Diagnosis not present

## 2020-08-21 DIAGNOSIS — B0089 Other herpesviral infection: Secondary | ICD-10-CM | POA: Diagnosis not present

## 2020-08-21 DIAGNOSIS — L821 Other seborrheic keratosis: Secondary | ICD-10-CM | POA: Diagnosis not present

## 2020-10-04 ENCOUNTER — Other Ambulatory Visit: Payer: Self-pay

## 2020-10-04 ENCOUNTER — Ambulatory Visit (INDEPENDENT_AMBULATORY_CARE_PROVIDER_SITE_OTHER)
Admission: RE | Admit: 2020-10-04 | Discharge: 2020-10-04 | Disposition: A | Discharge status: Home / Self Care | Payer: Medicare PPO | Source: Ambulatory Visit | Attending: Pulmonary Disease | Admitting: Pulmonary Disease

## 2020-10-04 DIAGNOSIS — R911 Solitary pulmonary nodule: Secondary | ICD-10-CM | POA: Diagnosis not present

## 2020-10-04 DIAGNOSIS — I700 Atherosclerosis of aorta: Secondary | ICD-10-CM | POA: Diagnosis not present

## 2020-10-04 DIAGNOSIS — J479 Bronchiectasis, uncomplicated: Secondary | ICD-10-CM | POA: Diagnosis not present

## 2020-10-11 ENCOUNTER — Encounter: Payer: Self-pay | Admitting: Pulmonary Disease

## 2020-10-11 ENCOUNTER — Ambulatory Visit: Payer: Medicare PPO | Admitting: Pulmonary Disease

## 2020-10-11 ENCOUNTER — Other Ambulatory Visit: Payer: Self-pay

## 2020-10-11 VITALS — BP 114/70 | HR 56 | Ht 66.0 in | Wt 140.6 lb

## 2020-10-11 DIAGNOSIS — J329 Chronic sinusitis, unspecified: Secondary | ICD-10-CM | POA: Diagnosis not present

## 2020-10-11 NOTE — Patient Instructions (Addendum)
Nice to see you again!  I sent a referral to ENT to look into the sinus and nasal congestion.  I sent a referral to speech therapy to look into the swallowing and choking   Return to clinic in 4 months

## 2020-10-15 NOTE — Progress Notes (Signed)
Patient ID: Charles Winters, male    DOB: Nov 03, 1950, 70 y.o.   MRN: [id number]  Chief Complaint  Patient presents with   Follow-up    34yr f/u. States he has been doing well since last visit. Is interested in allergy testing due to the constant runny nose.     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  Doing okay.  Breathing ok. Continues to have a lot of nasal and sinus congestion.  He continues to complain of recurrent sinus and nasal congestion.  Occasional recurrent sinus pressure and pain he relates to sinus headaches.  Continue Flonase.  Stop azelastine as it was causing nosebleeds this is resolved since discontinuation.  Described sensation of dysphagia and secondary aspiration symptoms.  Sometimes liquids.  Often very dry foods.  Reports having dry mouth.  Dry foods seem to get stuck and cause coughing shortly after swallowing.  We reviewed in detail his repeat CT scan that was recently performed.  This shows stability of bilateral small nodules.  No further follow-up needed.  HPI at initial visit: Patient received a coronary CT per his cardiologist given significant family history of CAD and early CAD. This demonstrated on my interpretation small patchy centrilobular nodules/consolidation in RML and larger but small similar appearing area in lingula.  In addition to scattered 4 mm nodules noted, on the right, on the left.  Also, scout film and CT scan looks a bit hyperinflated with increased AP size to my eye.  This prompted referral for further evaluation. He has no cough. He has no night sweats or fevers. No weight loss. He reports some intermittent DOE.  Not reliably reproducible but does occur from time to time especially when he bends over or when he is exercising.  He has a sensation that gas or air is trapped in his chest and has a hard time getting it out.   Upon further questioning he denies any exertional limitation due to dyspnea.  He is an avid exerciser and continues to do so without significant complaint although he does note he gets some shortness of breath from time to time when exercising.  However it sounds like he can push through this.  He is a never smoker.  No history of asthma in the past.  However, he does endorse significant seasonal allergies.  He also has sinus disease and has had surgery in the past.  He does feel like more recently the allergies seem worse as does nasal congestion.  He had PFTs performed at primary care office some years ago that was reportedly normal.  This was with and without bronchodilator per his report.  PMH: Seasonal allergies, sinus disease Prior surgical history: Cholecystectomy, sinus surgery Family history: Asthma in his mother, CAD in father, cancer in both mother and father including lung cancer in father.  Questionaires / Pulmonary Flowsheets:   ACT:  No flowsheet data found.  MMRC: No flowsheet data found.  Epworth:  No flowsheet data found.  Tests:   FENO:  No results found for: NITRICOXIDE  PFT: No flowsheet data found.  WALK:  No flowsheet data found.  Imaging: Personally reviewed and as per EMR and discussion this note Lab Results:  CBC No results found for: WBC, RBC, HGB, HCT, PLT, MCV, MCH, MCHC, RDW, LYMPHSABS, MONOABS, EOSABS, BASOSABS  BMET No results found for:  NA, K, CL, CO2, GLUCOSE, BUN, CREATININE, CALCIUM, GFRNONAA, GFRAA  BNP No results found for: BNP  ProBNP No results found for: PROBNP  Specialty Problems   None  No Known Allergies  Immunization History  Administered Date(s) Administered   PFIZER(Purple Top)SARS-COV-2 Vaccination 03/07/2019, 03/28/2019    History reviewed. No pertinent past medical history.  Tobacco History: Social History   Tobacco Use  Smoking Status Never  Smokeless Tobacco Never   Counseling given: Not  Answered   Continue to not smoke  Outpatient Encounter Medications as of 10/11/2020  Medication Sig   acyclovir (ZOVIRAX) 200 MG capsule Take 200 mg by mouth 5 (five) times daily.   acyclovir ointment (ZOVIRAX) 5 % Apply 1 application topically every 3 (three) hours.   antiseptic oral rinse (BIOTENE) LIQD 15 mLs by Mouth Rinse route as needed for dry mouth.   fluticasone (CUTIVATE) 0.005 % ointment APPLY 1 A SMALL AMOUNT TO AFFECTED AREA ONCE A DAY APPLY TO CORNER OF MOUTH   naproxen sodium (ANAPROX) 220 MG tablet Take 220 mg by mouth 2 (two) times daily with a meal.   pseudoephedrine (SUDAFED) 30 MG tablet Take 30 mg by mouth every 4 (four) hours as needed for congestion.   sildenafil (VIAGRA) 50 MG tablet Take 50 mg by mouth daily as needed for erectile dysfunction.   [DISCONTINUED] albuterol (VENTOLIN HFA) 108 (90 Base) MCG/ACT inhaler Inhale 2 puffs into the lungs every 6 (six) hours as needed for wheezing or shortness of breath.   [DISCONTINUED] azelastine (ASTELIN) 0.1 % nasal spray Place 1 spray into both nostrils 2 (two) times daily. Use in each nostril as directed   [DISCONTINUED] omeprazole (PRILOSEC) 20 MG capsule Take 20 mg by mouth daily.   No facility-administered encounter medications on file as of 10/11/2020.     ROS n/a  Physical Exam  BP 114/70   Pulse (!) 56   Ht 5\' 6"  (1.676 m)   Wt 140 lb 9.6 oz (63.8 kg)   SpO2 100% Comment: on RA  BMI 22.69 kg/m   Wt Readings from Last 5 Encounters:  10/11/20 140 lb 9.6 oz (63.8 kg)  01/24/20 140 lb (63.5 kg)  10/11/19 138 lb 9.6 oz (62.9 kg)    BMI Readings from Last 5 Encounters:  10/11/20 22.69 kg/m  01/24/20 22.60 kg/m  10/11/19 22.37 kg/m     Physical Exam General: Well-appearing, in no acute distress Neck: No JVP appreciated, supple Eyes: EOMI, no icterus Respiratory: Clear to auscultation bilaterally, no wheeze, no crackle Cardiovascular: Regular rate and rhythm, no murmur Abdomen: Nondistended, bowel  sounds present MSK: No synovitis, no joint effusions Neuro: Normal gait, no weakness Psych: Normal mood, full affect   Assessment & Plan:   Charles Winters is a 70 year old man whom we are seeing in follow up for for evaluation of abnormal CT scan of the chest and 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  Differential also includes scar from prior pneumonia (he reports none), mucus impaction from other inflammatory conditions such as asthma, or environmental insult.  No suspicion for active infection.  He has no cough.  No B symptoms.  No indication for antimicrobial therapy.  Given his lack of cough and sputum production, hypertonic saline unlikely be very beneficial.    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  Repeat CT scan 10/2020 shows stability of nodules, no further imaging per Fleischner criteria.  Chronic sinusitis: Has failed nasal therapies prescribed by me.  Previously followed by ENT.  Referral to ENT given recurrent sinus pain and headaches.  Dysphagia and aspiration symptoms: Mostly with dry foods but occasionally with liquids.  Barium swallow in the past normal.  He says that once his mouth is lubricated he has not had issues.  Referred to speech-language pathology for swallow evaluation, possible fees.  Hopeful we can identify something that he can improve on with ongoing speech therapy for swallowing.  Return in about 4 months (around 02/10/2021).   Karren Burly, MD 10/15/2020  I spent 42 minutes in caring for the patient for this encounter including review of records, face-to-face visit, coordination of care.

## 2020-10-26 ENCOUNTER — Telehealth: Payer: Self-pay | Admitting: Pulmonary Disease

## 2020-10-26 NOTE — Telephone Encounter (Signed)
Called and spoke with patient. He stated that the place MH referred him to does not do swallowing evals. They only deal with speech therapy. He wants to hold off on the swallowing eval until his ENT appt next month. He is scheduled to see an ENT on 11/16/20 at [facility]. He wants to discuss with them his swallowing concerns and see if they will be able to do a scope. He last had a scope done about 15 years ago.   I advised him to let us know if anything changes or if he changes his mind. He verbalized understanding.   Nothing further needed at time of call.

## 2020-10-30 DIAGNOSIS — H2513 Age-related nuclear cataract, bilateral: Secondary | ICD-10-CM | POA: Diagnosis not present

## 2020-10-30 DIAGNOSIS — H353132 Nonexudative age-related macular degeneration, bilateral, intermediate dry stage: Secondary | ICD-10-CM | POA: Diagnosis not present

## 2020-10-30 DIAGNOSIS — H35372 Puckering of macula, left eye: Secondary | ICD-10-CM | POA: Diagnosis not present

## 2020-10-30 DIAGNOSIS — H43823 Vitreomacular adhesion, bilateral: Secondary | ICD-10-CM | POA: Diagnosis not present

## 2020-11-07 DIAGNOSIS — H25813 Combined forms of age-related cataract, bilateral: Secondary | ICD-10-CM | POA: Diagnosis not present

## 2020-11-07 DIAGNOSIS — H401222 Low-tension glaucoma, left eye, moderate stage: Secondary | ICD-10-CM | POA: Diagnosis not present

## 2020-11-07 DIAGNOSIS — H353132 Nonexudative age-related macular degeneration, bilateral, intermediate dry stage: Secondary | ICD-10-CM | POA: Diagnosis not present

## 2020-11-14 DIAGNOSIS — H2513 Age-related nuclear cataract, bilateral: Secondary | ICD-10-CM | POA: Diagnosis not present

## 2020-11-14 DIAGNOSIS — H401222 Low-tension glaucoma, left eye, moderate stage: Secondary | ICD-10-CM | POA: Diagnosis not present

## 2020-11-14 DIAGNOSIS — H353131 Nonexudative age-related macular degeneration, bilateral, early dry stage: Secondary | ICD-10-CM | POA: Diagnosis not present

## 2020-11-14 DIAGNOSIS — H401211 Low-tension glaucoma, right eye, mild stage: Secondary | ICD-10-CM | POA: Diagnosis not present

## 2020-11-16 DIAGNOSIS — J3489 Other specified disorders of nose and nasal sinuses: Secondary | ICD-10-CM | POA: Diagnosis not present

## 2020-11-16 DIAGNOSIS — R0981 Nasal congestion: Secondary | ICD-10-CM | POA: Diagnosis not present

## 2020-11-16 DIAGNOSIS — K219 Gastro-esophageal reflux disease without esophagitis: Secondary | ICD-10-CM | POA: Diagnosis not present

## 2020-11-16 DIAGNOSIS — J342 Deviated nasal septum: Secondary | ICD-10-CM | POA: Diagnosis not present

## 2020-12-24 DIAGNOSIS — H25813 Combined forms of age-related cataract, bilateral: Secondary | ICD-10-CM | POA: Diagnosis not present

## 2020-12-24 DIAGNOSIS — H401211 Low-tension glaucoma, right eye, mild stage: Secondary | ICD-10-CM | POA: Diagnosis not present

## 2020-12-24 DIAGNOSIS — H401222 Low-tension glaucoma, left eye, moderate stage: Secondary | ICD-10-CM | POA: Diagnosis not present

## 2020-12-24 DIAGNOSIS — H353132 Nonexudative age-related macular degeneration, bilateral, intermediate dry stage: Secondary | ICD-10-CM | POA: Diagnosis not present

## 2021-01-03 DIAGNOSIS — M79672 Pain in left foot: Secondary | ICD-10-CM | POA: Diagnosis not present

## 2021-01-03 DIAGNOSIS — S92102A Unspecified fracture of left talus, initial encounter for closed fracture: Secondary | ICD-10-CM | POA: Diagnosis not present

## 2021-01-07 DIAGNOSIS — M79672 Pain in left foot: Secondary | ICD-10-CM | POA: Diagnosis not present

## 2021-01-07 DIAGNOSIS — S92115A Nondisplaced fracture of neck of left talus, initial encounter for closed fracture: Secondary | ICD-10-CM | POA: Diagnosis not present

## 2021-02-14 DIAGNOSIS — H25811 Combined forms of age-related cataract, right eye: Secondary | ICD-10-CM | POA: Diagnosis not present

## 2021-02-14 DIAGNOSIS — H401222 Low-tension glaucoma, left eye, moderate stage: Secondary | ICD-10-CM | POA: Diagnosis not present

## 2021-02-14 DIAGNOSIS — H2512 Age-related nuclear cataract, left eye: Secondary | ICD-10-CM | POA: Diagnosis not present

## 2021-02-14 DIAGNOSIS — H401211 Low-tension glaucoma, right eye, mild stage: Secondary | ICD-10-CM | POA: Diagnosis not present

## 2021-04-16 DIAGNOSIS — R051 Acute cough: Secondary | ICD-10-CM | POA: Diagnosis not present

## 2021-04-16 DIAGNOSIS — Z03818 Encounter for observation for suspected exposure to other biological agents ruled out: Secondary | ICD-10-CM | POA: Diagnosis not present

## 2021-04-16 DIAGNOSIS — R519 Headache, unspecified: Secondary | ICD-10-CM | POA: Diagnosis not present

## 2021-04-30 DIAGNOSIS — H353132 Nonexudative age-related macular degeneration, bilateral, intermediate dry stage: Secondary | ICD-10-CM | POA: Diagnosis not present

## 2021-04-30 DIAGNOSIS — H401211 Low-tension glaucoma, right eye, mild stage: Secondary | ICD-10-CM | POA: Diagnosis not present

## 2021-04-30 DIAGNOSIS — H401222 Low-tension glaucoma, left eye, moderate stage: Secondary | ICD-10-CM | POA: Diagnosis not present

## 2021-04-30 DIAGNOSIS — H25813 Combined forms of age-related cataract, bilateral: Secondary | ICD-10-CM | POA: Diagnosis not present

## 2021-04-30 DIAGNOSIS — H43821 Vitreomacular adhesion, right eye: Secondary | ICD-10-CM | POA: Diagnosis not present

## 2021-05-06 DIAGNOSIS — R5383 Other fatigue: Secondary | ICD-10-CM | POA: Diagnosis not present

## 2021-05-06 DIAGNOSIS — N529 Male erectile dysfunction, unspecified: Secondary | ICD-10-CM | POA: Diagnosis not present

## 2021-05-06 DIAGNOSIS — Z79899 Other long term (current) drug therapy: Secondary | ICD-10-CM | POA: Diagnosis not present

## 2021-05-07 DIAGNOSIS — H35373 Puckering of macula, bilateral: Secondary | ICD-10-CM | POA: Diagnosis not present

## 2021-05-07 DIAGNOSIS — H401130 Primary open-angle glaucoma, bilateral, stage unspecified: Secondary | ICD-10-CM | POA: Diagnosis not present

## 2021-05-07 DIAGNOSIS — Z79899 Other long term (current) drug therapy: Secondary | ICD-10-CM | POA: Diagnosis not present

## 2021-05-07 DIAGNOSIS — H43823 Vitreomacular adhesion, bilateral: Secondary | ICD-10-CM | POA: Diagnosis not present

## 2021-05-07 DIAGNOSIS — H353132 Nonexudative age-related macular degeneration, bilateral, intermediate dry stage: Secondary | ICD-10-CM | POA: Diagnosis not present

## 2021-05-07 DIAGNOSIS — R5383 Other fatigue: Secondary | ICD-10-CM | POA: Diagnosis not present

## 2021-05-13 DIAGNOSIS — N529 Male erectile dysfunction, unspecified: Secondary | ICD-10-CM | POA: Diagnosis not present

## 2021-05-13 DIAGNOSIS — E538 Deficiency of other specified B group vitamins: Secondary | ICD-10-CM | POA: Diagnosis not present

## 2021-05-14 DIAGNOSIS — H409 Unspecified glaucoma: Secondary | ICD-10-CM | POA: Diagnosis not present

## 2021-05-17 DIAGNOSIS — H35373 Puckering of macula, bilateral: Secondary | ICD-10-CM | POA: Diagnosis not present

## 2021-05-17 DIAGNOSIS — H353132 Nonexudative age-related macular degeneration, bilateral, intermediate dry stage: Secondary | ICD-10-CM | POA: Diagnosis not present

## 2021-05-17 DIAGNOSIS — H43813 Vitreous degeneration, bilateral: Secondary | ICD-10-CM | POA: Diagnosis not present

## 2021-05-17 DIAGNOSIS — H43822 Vitreomacular adhesion, left eye: Secondary | ICD-10-CM | POA: Diagnosis not present

## 2021-05-21 DIAGNOSIS — E291 Testicular hypofunction: Secondary | ICD-10-CM | POA: Diagnosis not present

## 2021-06-05 DIAGNOSIS — H25813 Combined forms of age-related cataract, bilateral: Secondary | ICD-10-CM | POA: Diagnosis not present

## 2021-06-05 DIAGNOSIS — H43821 Vitreomacular adhesion, right eye: Secondary | ICD-10-CM | POA: Diagnosis not present

## 2021-06-05 DIAGNOSIS — H401222 Low-tension glaucoma, left eye, moderate stage: Secondary | ICD-10-CM | POA: Diagnosis not present

## 2021-06-05 DIAGNOSIS — H401211 Low-tension glaucoma, right eye, mild stage: Secondary | ICD-10-CM | POA: Diagnosis not present

## 2021-06-05 DIAGNOSIS — H353132 Nonexudative age-related macular degeneration, bilateral, intermediate dry stage: Secondary | ICD-10-CM | POA: Diagnosis not present

## 2021-06-20 DIAGNOSIS — H25813 Combined forms of age-related cataract, bilateral: Secondary | ICD-10-CM | POA: Diagnosis not present

## 2021-07-04 DIAGNOSIS — H25811 Combined forms of age-related cataract, right eye: Secondary | ICD-10-CM | POA: Diagnosis not present

## 2021-07-04 DIAGNOSIS — H401222 Low-tension glaucoma, left eye, moderate stage: Secondary | ICD-10-CM | POA: Diagnosis not present

## 2021-07-04 DIAGNOSIS — H401211 Low-tension glaucoma, right eye, mild stage: Secondary | ICD-10-CM | POA: Diagnosis not present

## 2021-07-04 DIAGNOSIS — H25813 Combined forms of age-related cataract, bilateral: Secondary | ICD-10-CM | POA: Diagnosis not present

## 2021-07-04 DIAGNOSIS — H353132 Nonexudative age-related macular degeneration, bilateral, intermediate dry stage: Secondary | ICD-10-CM | POA: Diagnosis not present

## 2021-07-04 DIAGNOSIS — Z79899 Other long term (current) drug therapy: Secondary | ICD-10-CM | POA: Diagnosis not present

## 2021-07-15 DIAGNOSIS — H35373 Puckering of macula, bilateral: Secondary | ICD-10-CM | POA: Diagnosis not present

## 2021-07-15 DIAGNOSIS — H353132 Nonexudative age-related macular degeneration, bilateral, intermediate dry stage: Secondary | ICD-10-CM | POA: Diagnosis not present

## 2021-07-15 DIAGNOSIS — H43822 Vitreomacular adhesion, left eye: Secondary | ICD-10-CM | POA: Diagnosis not present

## 2021-07-15 DIAGNOSIS — H43813 Vitreous degeneration, bilateral: Secondary | ICD-10-CM | POA: Diagnosis not present

## 2021-07-24 DIAGNOSIS — H33311 Horseshoe tear of retina without detachment, right eye: Secondary | ICD-10-CM | POA: Diagnosis not present

## 2021-07-24 DIAGNOSIS — H35371 Puckering of macula, right eye: Secondary | ICD-10-CM | POA: Diagnosis not present

## 2021-08-02 DIAGNOSIS — H31091 Other chorioretinal scars, right eye: Secondary | ICD-10-CM | POA: Diagnosis not present

## 2021-08-02 DIAGNOSIS — H35373 Puckering of macula, bilateral: Secondary | ICD-10-CM | POA: Diagnosis not present

## 2021-08-09 DIAGNOSIS — H409 Unspecified glaucoma: Secondary | ICD-10-CM | POA: Diagnosis not present

## 2021-08-09 DIAGNOSIS — K219 Gastro-esophageal reflux disease without esophagitis: Secondary | ICD-10-CM | POA: Diagnosis not present

## 2021-08-09 DIAGNOSIS — D696 Thrombocytopenia, unspecified: Secondary | ICD-10-CM | POA: Diagnosis not present

## 2021-08-09 DIAGNOSIS — E042 Nontoxic multinodular goiter: Secondary | ICD-10-CM | POA: Diagnosis not present

## 2021-08-09 DIAGNOSIS — E291 Testicular hypofunction: Secondary | ICD-10-CM | POA: Diagnosis not present

## 2021-08-09 DIAGNOSIS — Z0000 Encounter for general adult medical examination without abnormal findings: Secondary | ICD-10-CM | POA: Diagnosis not present

## 2021-08-09 DIAGNOSIS — Z79899 Other long term (current) drug therapy: Secondary | ICD-10-CM | POA: Diagnosis not present

## 2021-08-12 DIAGNOSIS — Z79899 Other long term (current) drug therapy: Secondary | ICD-10-CM | POA: Diagnosis not present

## 2021-08-12 DIAGNOSIS — E291 Testicular hypofunction: Secondary | ICD-10-CM | POA: Diagnosis not present

## 2021-08-22 DIAGNOSIS — S90112A Contusion of left great toe without damage to nail, initial encounter: Secondary | ICD-10-CM | POA: Diagnosis not present

## 2021-08-22 DIAGNOSIS — D1801 Hemangioma of skin and subcutaneous tissue: Secondary | ICD-10-CM | POA: Diagnosis not present

## 2021-08-22 DIAGNOSIS — L570 Actinic keratosis: Secondary | ICD-10-CM | POA: Diagnosis not present

## 2021-08-22 DIAGNOSIS — L821 Other seborrheic keratosis: Secondary | ICD-10-CM | POA: Diagnosis not present

## 2021-08-22 DIAGNOSIS — I8391 Asymptomatic varicose veins of right lower extremity: Secondary | ICD-10-CM | POA: Diagnosis not present

## 2021-08-22 DIAGNOSIS — L814 Other melanin hyperpigmentation: Secondary | ICD-10-CM | POA: Diagnosis not present

## 2021-08-26 DIAGNOSIS — H35373 Puckering of macula, bilateral: Secondary | ICD-10-CM | POA: Diagnosis not present

## 2021-08-26 DIAGNOSIS — H43822 Vitreomacular adhesion, left eye: Secondary | ICD-10-CM | POA: Diagnosis not present

## 2021-09-16 DIAGNOSIS — H401222 Low-tension glaucoma, left eye, moderate stage: Secondary | ICD-10-CM | POA: Diagnosis not present

## 2021-09-16 DIAGNOSIS — H401211 Low-tension glaucoma, right eye, mild stage: Secondary | ICD-10-CM | POA: Diagnosis not present

## 2021-09-16 DIAGNOSIS — H25812 Combined forms of age-related cataract, left eye: Secondary | ICD-10-CM | POA: Diagnosis not present

## 2021-09-16 DIAGNOSIS — H25811 Combined forms of age-related cataract, right eye: Secondary | ICD-10-CM | POA: Diagnosis not present

## 2021-09-16 DIAGNOSIS — H353132 Nonexudative age-related macular degeneration, bilateral, intermediate dry stage: Secondary | ICD-10-CM | POA: Diagnosis not present

## 2021-10-30 DIAGNOSIS — H401222 Low-tension glaucoma, left eye, moderate stage: Secondary | ICD-10-CM | POA: Diagnosis not present

## 2021-10-30 DIAGNOSIS — H353132 Nonexudative age-related macular degeneration, bilateral, intermediate dry stage: Secondary | ICD-10-CM | POA: Diagnosis not present

## 2021-10-30 DIAGNOSIS — H25812 Combined forms of age-related cataract, left eye: Secondary | ICD-10-CM | POA: Diagnosis not present

## 2021-10-30 DIAGNOSIS — H401211 Low-tension glaucoma, right eye, mild stage: Secondary | ICD-10-CM | POA: Diagnosis not present

## 2021-11-01 DIAGNOSIS — H35372 Puckering of macula, left eye: Secondary | ICD-10-CM | POA: Diagnosis not present

## 2021-11-01 DIAGNOSIS — H3509 Other intraretinal microvascular abnormalities: Secondary | ICD-10-CM | POA: Diagnosis not present

## 2021-11-01 DIAGNOSIS — H43822 Vitreomacular adhesion, left eye: Secondary | ICD-10-CM | POA: Diagnosis not present

## 2021-11-01 DIAGNOSIS — H3561 Retinal hemorrhage, right eye: Secondary | ICD-10-CM | POA: Diagnosis not present

## 2021-11-01 DIAGNOSIS — H353132 Nonexudative age-related macular degeneration, bilateral, intermediate dry stage: Secondary | ICD-10-CM | POA: Diagnosis not present

## 2021-11-25 DIAGNOSIS — H35372 Puckering of macula, left eye: Secondary | ICD-10-CM | POA: Diagnosis not present

## 2021-11-25 DIAGNOSIS — H43822 Vitreomacular adhesion, left eye: Secondary | ICD-10-CM | POA: Diagnosis not present

## 2021-11-25 DIAGNOSIS — H353132 Nonexudative age-related macular degeneration, bilateral, intermediate dry stage: Secondary | ICD-10-CM | POA: Diagnosis not present

## 2021-11-25 DIAGNOSIS — H3561 Retinal hemorrhage, right eye: Secondary | ICD-10-CM | POA: Diagnosis not present

## 2021-11-25 DIAGNOSIS — H3509 Other intraretinal microvascular abnormalities: Secondary | ICD-10-CM | POA: Diagnosis not present

## 2021-12-03 DIAGNOSIS — K219 Gastro-esophageal reflux disease without esophagitis: Secondary | ICD-10-CM | POA: Diagnosis not present

## 2021-12-03 DIAGNOSIS — H25812 Combined forms of age-related cataract, left eye: Secondary | ICD-10-CM | POA: Diagnosis not present

## 2021-12-03 DIAGNOSIS — H401211 Low-tension glaucoma, right eye, mild stage: Secondary | ICD-10-CM | POA: Diagnosis not present

## 2021-12-03 DIAGNOSIS — Z79899 Other long term (current) drug therapy: Secondary | ICD-10-CM | POA: Diagnosis not present

## 2021-12-03 DIAGNOSIS — Z79890 Hormone replacement therapy (postmenopausal): Secondary | ICD-10-CM | POA: Diagnosis not present

## 2021-12-03 DIAGNOSIS — H401222 Low-tension glaucoma, left eye, moderate stage: Secondary | ICD-10-CM | POA: Diagnosis not present

## 2021-12-04 DIAGNOSIS — H401222 Low-tension glaucoma, left eye, moderate stage: Secondary | ICD-10-CM | POA: Diagnosis not present

## 2021-12-04 DIAGNOSIS — Z961 Presence of intraocular lens: Secondary | ICD-10-CM | POA: Diagnosis not present

## 2021-12-04 DIAGNOSIS — Z9842 Cataract extraction status, left eye: Secondary | ICD-10-CM | POA: Diagnosis not present

## 2022-04-21 IMAGING — CT CT CARDIAC CORONARY ARTERY CALCIUM SCORE
3 series · 14 of 20 positions shown, 15 images · non-contrast
Comparison: None.
COMPARISON: None.

Addendum:
EXAM:
OVER-READ INTERPRETATION  CT CHEST

The following report is an over-read performed by radiologist Dr.
Grissel Mireles [REDACTED] on 08/25/2019. This
over-read does not include interpretation of cardiac or coronary
anatomy or pathology. The coronary calcium score/coronary CTA
interpretation by the cardiologist is attached.
CLINICAL DATA: Risk stratification
Coronary Calcium Score
TECHNIQUE: The patient was scanned on a Siemens Force scanner. Axial
non-contrast 3 mm slices were carried out through the heart. The
data set was analyzed on a dedicated work station and scored using
the Agatson method.

[Series 2: casc 3.0 bv41 2 bestdiast 68 % · axial · 0.34mm/px · z∈[-284,-194]mm · 4 of 50 slices shown, 5 images]
[im 10/50  vessel]
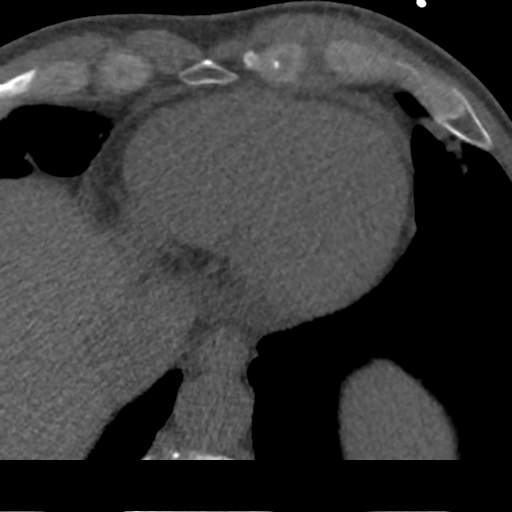
[im 10/50  lung]
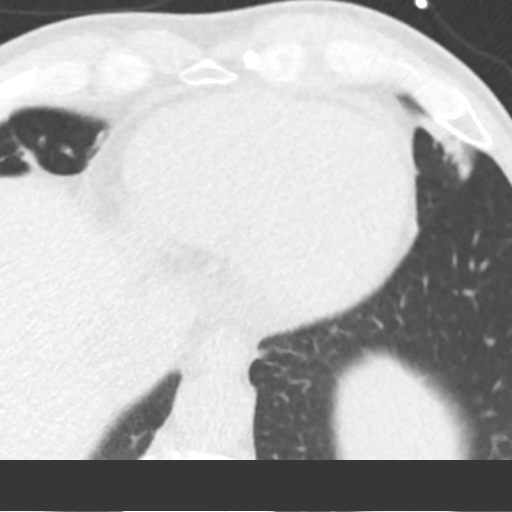
[im 20/50  vessel]
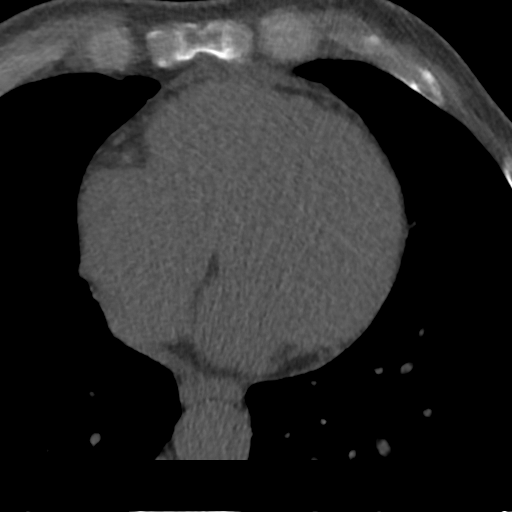
[im 30/50  vessel]
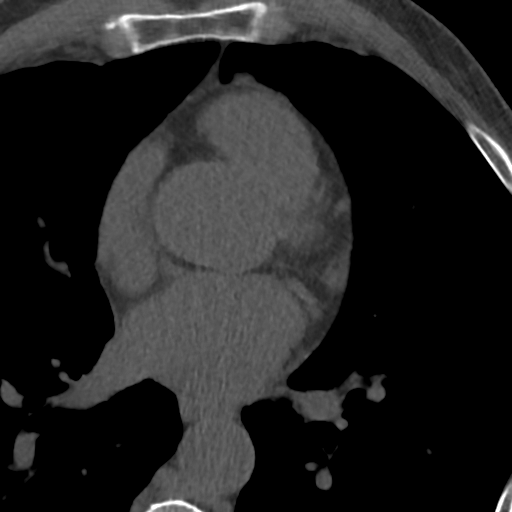
[im 40/50  vessel]
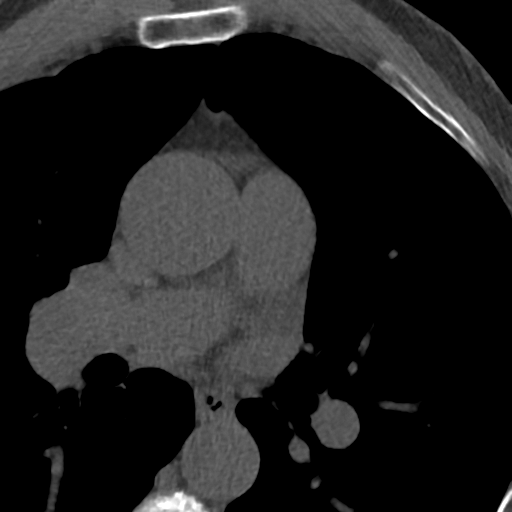

[Series 3: lung 66 % · axial · 0.65mm/px · z∈[-287,-191]mm · 5 of 50 slices shown]
[im 9/50  lung]
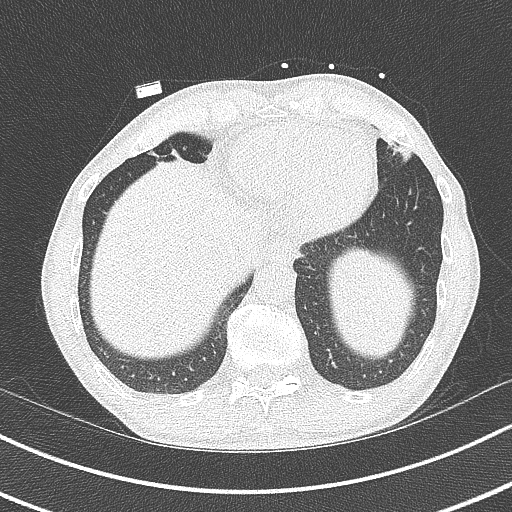
[im 17/50  lung]
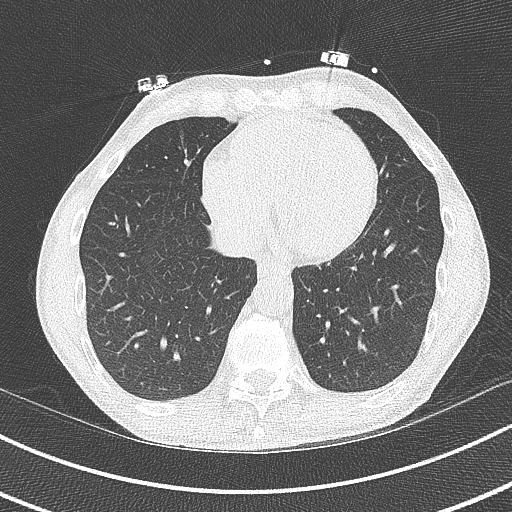
[im 25/50  lung]
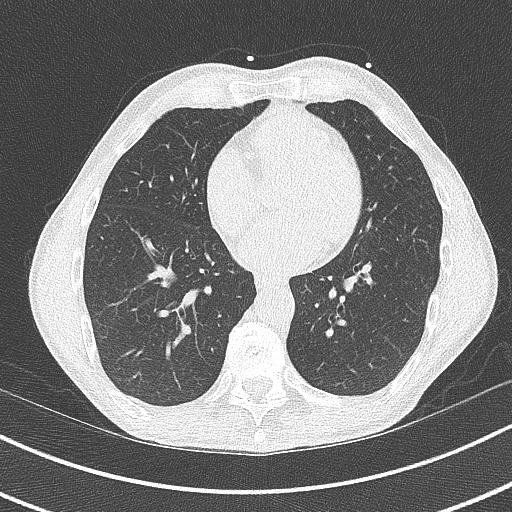
[im 33/50  lung]
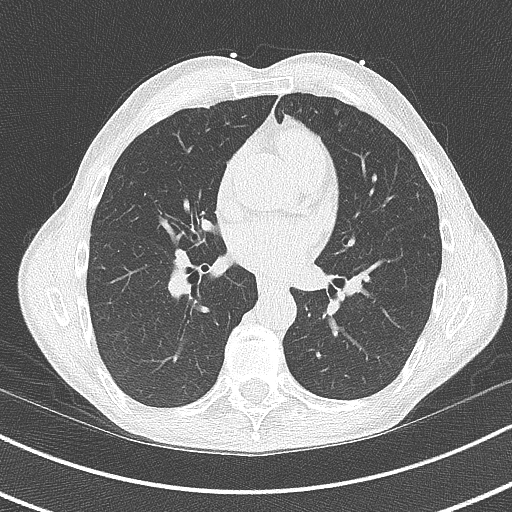
[im 41/50  lung]
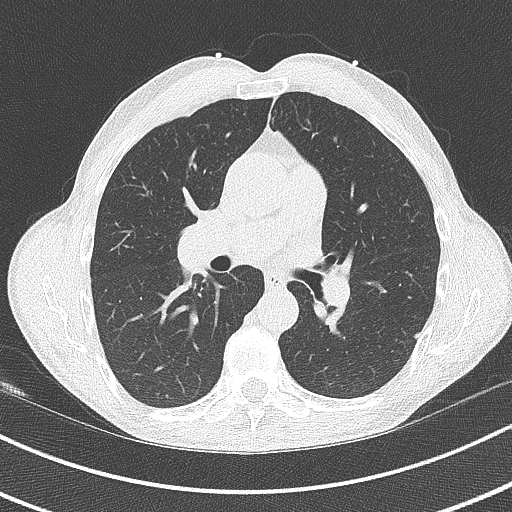

[Series 4: lung st 66 % · axial · 0.65mm/px · z∈[-287,-191]mm · 5 of 50 slices shown]
[im 9/50  lung]
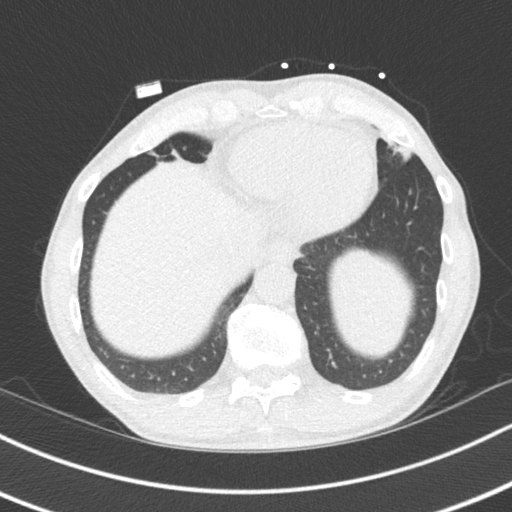
[im 17/50  lung]
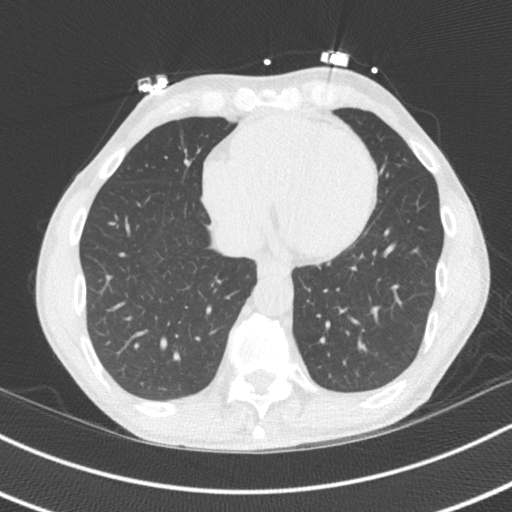
[im 25/50  lung]
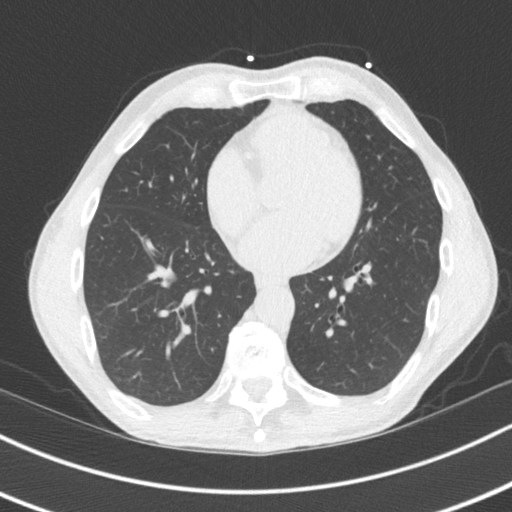
[im 33/50  lung]
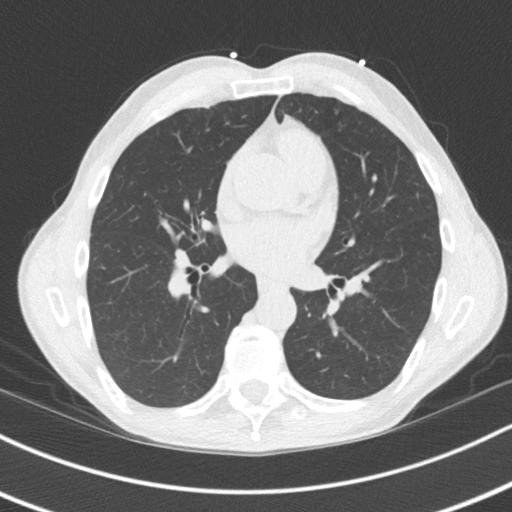
[im 41/50  lung]
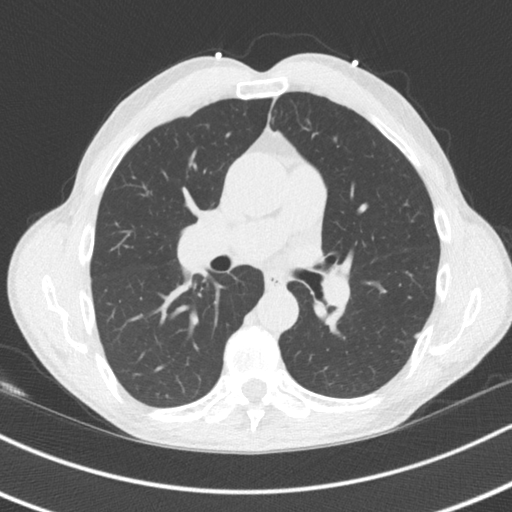

[14 of 20 positions shown; findings below may reference images not displayed]

FINDINGS: Ectasia of ascending thoracic aorta (4.1 cm in diameter). Areas of
very mild cylindrical bronchiectasis with peribronchovascular micro
nodularity in the right middle lobe and inferior segment of the
lingula, likely to reflect areas of mucoid impaction. 4 mm right
lower lobe pulmonary nodule (axial image 16 of series 3). Subpleural
nodule in the periphery of the left lower lobe (axial image 9 of
series 3) measuring 3 mm. Within the visualized portions of the
thorax there are no suspicious appearing pulmonary nodules or
masses, there is no acute consolidative airspace disease, no pleural
effusions, no pneumothorax and no lymphadenopathy. Visualized
portions of the upper abdomen are unremarkable. There are no
aggressive appearing lytic or blastic lesions noted in the
visualized portions of the skeleton.
IMPRESSION: 1. Small pulmonary nodules measuring 4 mm or less in size,
nonspecific, but statistically likely benign. No follow-up needed if
patient is low-risk (and has no known or suspected primary
neoplasm). Non-contrast chest CT can be considered in 12 months if
patient is high-risk. This recommendation follows the consensus
statement: Guidelines for Management of Incidental Pulmonary Nodules
Detected on CT Images: From the [HOSPITAL] 0704; Radiology
0704; [DATE].
2. Changes in the right middle lobe and inferior segment of the
lingula suggestive of a chronic indolent atypical infectious process
with associated mucoid impaction, such as mycobacterium avium
intracellulare (BIOJO), as above.
FINDINGS: Non-cardiac: See separate report from [REDACTED].

Ascending Aorta: Normal caliber.

Pericardium: Normal.

Coronary arteries: Normal origins.
IMPRESSION: Coronary calcium score of 1. This was 17th percentile for age and
sex matched controls.

*** End of Addendum ***
EXAM:
OVER-READ INTERPRETATION  CT CHEST

The following report is an over-read performed by radiologist Dr.
Grissel Mireles [REDACTED] on 08/25/2019. This
over-read does not include interpretation of cardiac or coronary
anatomy or pathology. The coronary calcium score/coronary CTA
interpretation by the cardiologist is attached.
FINDINGS: Ectasia of ascending thoracic aorta (4.1 cm in diameter). Areas of
very mild cylindrical bronchiectasis with peribronchovascular micro
nodularity in the right middle lobe and inferior segment of the
lingula, likely to reflect areas of mucoid impaction. 4 mm right
lower lobe pulmonary nodule (axial image 16 of series 3). Subpleural
nodule in the periphery of the left lower lobe (axial image 9 of
series 3) measuring 3 mm. Within the visualized portions of the
thorax there are no suspicious appearing pulmonary nodules or
masses, there is no acute consolidative airspace disease, no pleural
effusions, no pneumothorax and no lymphadenopathy. Visualized
portions of the upper abdomen are unremarkable. There are no
aggressive appearing lytic or blastic lesions noted in the
visualized portions of the skeleton.
IMPRESSION: 1. Small pulmonary nodules measuring 4 mm or less in size,
nonspecific, but statistically likely benign. No follow-up needed if
patient is low-risk (and has no known or suspected primary
neoplasm). Non-contrast chest CT can be considered in 12 months if
patient is high-risk. This recommendation follows the consensus
statement: Guidelines for Management of Incidental Pulmonary Nodules
Detected on CT Images: From the [HOSPITAL] 0704; Radiology
0704; [DATE].
2. Changes in the right middle lobe and inferior segment of the
lingula suggestive of a chronic indolent atypical infectious process
with associated mucoid impaction, such as mycobacterium avium
intracellulare (BIOJO), as above.

## 2022-08-12 ENCOUNTER — Ambulatory Visit: Payer: Medicare PPO | Admitting: Internal Medicine

## 2022-08-12 ENCOUNTER — Encounter: Payer: Self-pay | Admitting: Internal Medicine

## 2022-08-12 ENCOUNTER — Other Ambulatory Visit: Payer: Self-pay

## 2022-08-12 VITALS — BP 118/80 | HR 54 | Temp 98.3°F | Ht 65.75 in | Wt 145.4 lb

## 2022-08-12 DIAGNOSIS — Z87898 Personal history of other specified conditions: Secondary | ICD-10-CM | POA: Diagnosis not present

## 2022-08-12 DIAGNOSIS — J3089 Other allergic rhinitis: Secondary | ICD-10-CM

## 2022-08-12 DIAGNOSIS — K219 Gastro-esophageal reflux disease without esophagitis: Secondary | ICD-10-CM | POA: Diagnosis not present

## 2022-08-12 DIAGNOSIS — J343 Hypertrophy of nasal turbinates: Secondary | ICD-10-CM

## 2022-08-12 MED ORDER — CETIRIZINE HCL 10 MG PO TABS: 10.0000 mg | ORAL_TABLET | Freq: Every day | ORAL | 5 refills | Status: AC

## 2022-08-12 MED ORDER — IPRATROPIUM BROMIDE 0.06 % NA SOLN: 2.0000 | Freq: Three times a day (TID) | NASAL | 5 refills | Status: AC | PRN

## 2022-08-12 MED ORDER — OMEPRAZOLE 20 MG PO CPDR: 20.0000 mg | DELAYED_RELEASE_CAPSULE | Freq: Every day | ORAL | 1 refills | Status: AC

## 2022-08-12 NOTE — Patient Instructions (Addendum)
Mixed Rhinitis: - Positive skin test 08/2022: dust mite  - Avoidance measures discussed. - Use nasal saline rinses before nose sprays such as with Neilmed Sinus Rinse.  Use distilled water.   - Use Ipratroprium 1-2 sprays up to three times daily as needed for runny nose. Aim upward and outward. - Use Zyrtec 10 mg daily.  - Consider allergy shots as long term control of your symptoms by teaching your immune system to be more tolerant of your allergy triggers.   GERD -Continue Prilosec 20mg  daily; can try to wean to every other day but if symptoms uncontrolled go back to daily.  Continue follow up with GI.  -Avoid lying down for at least two hours after a meal or after drinking acidic beverages, like soda, or other caffeinated beverages. This can help to prevent stomach contents from flowing back into the esophagus. -Keep your head elevated while you sleep. Using an extra pillow or two can also help to prevent reflux. -Eat smaller and more frequent meals each day instead of a few large meals. This promotes digestion and can aid in preventing heartburn. -Wear loose-fitting clothes to ease pressure on the stomach, which can worsen heartburn and reflux. -Reduce excess weight around the midsection. This can ease pressure on the stomach. Such pressure can force some stomach contents back up the esophagus.    ALLERGEN AVOIDANCE MEASURES   Dust Mites Use central air conditioning and heat; and change the filter monthly.  Pleated filters work better than mesh filters.  Electrostatic filters may also be used; wash the filter monthly.  Window air conditioners may be used, but do not clean the air as well as a central air conditioner.  Change or wash the filter monthly. Keep windows closed.  Do not use attic fans.   Encase the mattress, box springs and pillows with zippered, dust proof covers. Wash the bed linens in hot water weekly.   Remove carpet, especially from the bedroom. Remove stuffed animals,  throw pillows, dust ruffles, heavy drapes and other items that collect dust from the bedroom. Do not use a humidifier.   Use wood, vinyl or leather furniture instead of cloth furniture in the bedroom. Keep the indoor humidity at 30 - 40%.  Monitor with a humidity gauge.

## 2022-08-12 NOTE — Progress Notes (Signed)
NEW PATIENT  Date of Service/Encounter:  08/12/22  Consult requested by: Emilio Aspen, MD   Subjective:   Charles Winters (DOB: [birth year]) is a 72 y.o. male who presents to the clinic on 08/12/2022 with a chief complaint of Establish Care (Pt states he feels like he has a lot of mucus in his throat and drainage ), Allergy Testing, Nasal Congestion, and Sinus Problem .    History obtained from: chart review and patient.   Rhinitis:  Started years ago but worse in past 3-4 years and usually around Spring time. This year, he recalls around April, he had several days of excessive mucous drainage from his throat and also felt "lousy."  Was taking Mucinex at the time and recalls one episode where a large string of mucous just came out.    Symptoms include: nasal congestion, rhinorrhea, and post nasal drainage, headaches Occurs seasonally-Spring Potential triggers: not sure  Treatments tried:  Flonase, did not help; tried it for at least month  Afrin, 2 days a week  Tried Azelastine in the past but caused dry mouth and has issues with cavities   Previous allergy testing: yes; 30 years ago, can't recall the exact results but positive dust mites  History of reflux/heartburn: no History of sinus surgery: none Nonallergic triggers: gym and exercise    GERD: Followed by GI.  On prilosec for almost 30 years. Has tried stopping it but develops symptoms of reflux and heartburn.    Past Medical History: History reviewed. No pertinent past medical history.  Past Surgical History: Past Surgical History:  Procedure Laterality Date   SINOSCOPY      Family History: Family History  Problem Relation Age of Onset   Allergic rhinitis Brother     Social History:  Lives in a 17 year townhouse Flooring in bedroom: wood Pets: none Tobacco use/exposure: none Job: retired   Medication List:  Allergies as of 08/12/2022   No Known Allergies      Medication List         Accurate as of August 12, 2022  1:26 PM. If you have any questions, ask your nurse or doctor.          acyclovir 200 MG capsule Commonly known as: ZOVIRAX Take 200 mg by mouth 5 (five) times daily.   acyclovir ointment 5 % Commonly known as: ZOVIRAX Apply 1 application topically every 3 (three) hours.   antiseptic oral rinse Liqd 15 mLs by Mouth Rinse route as needed for dry mouth.   cetirizine 10 MG tablet Commonly known as: ZyrTEC Allergy Take 1 tablet (10 mg total) by mouth daily. Started by: Birder Robson, MD   fluticasone 0.005 % ointment Commonly known as: CUTIVATE APPLY 1 A SMALL AMOUNT TO AFFECTED AREA ONCE A DAY APPLY TO CORNER OF MOUTH   ipratropium 0.06 % nasal spray Commonly known as: ATROVENT Place 2 sprays into both nostrils 3 (three) times daily as needed for rhinitis. Started by: Birder Robson, MD   naproxen sodium 220 MG tablet Commonly known as: ALEVE Take 220 mg by mouth 2 (two) times daily with a meal.   omeprazole 20 MG capsule Commonly known as: PRILOSEC Take 1 capsule (20 mg total) by mouth daily. Started by: Birder Robson, MD   pseudoephedrine 30 MG tablet Commonly known as: SUDAFED Take 30 mg by mouth every 4 (four) hours as needed for congestion.   sildenafil 50 MG tablet Commonly known as: VIAGRA Take 50 mg by  mouth daily as needed for erectile dysfunction.         REVIEW OF SYSTEMS: Pertinent positives and negatives discussed in HPI.   Objective:   Physical Exam: BP 118/80   Pulse (!) 54   Temp 98.3 F (36.8 C)   Ht 5' 5.75" (1.67 m)   Wt 145 lb 6.4 oz (66 kg)   SpO2 100%   BMI 23.65 kg/m  Body mass index is 23.65 kg/m. GEN: alert, well developed HEENT: clear conjunctiva, TM grey and translucent, nose with + inferior turbinate hypertrophy, pink nasal mucosa, slight clear rhinorrhea, no cobblestoning HEART: regular rate and rhythm, no murmur LUNGS: clear to auscultation bilaterally, no coughing, unlabored  respiration ABDOMEN: soft, non distended  SKIN: no rashes or lesions  Reviewed:  04/04/2022: followed by optho for low tension glaucoma, macular degeneration.  On dorzolamide and latanoprost.   10/11/2020: seen by Pulm for DOE, chronic sinusitis with sinus pain and headaches.  Referred to Allergy, failed use of nasal sprays- steroids and Azelastine.    11/16/2020: seen by ENT for nasal congestion, failed INCS and INAH.  Tried Afrin. Has septal deviation and LPR.  Discussed restarting Flonase, Azelastine, nasal saline and PPI.    Skin Testing:  Skin prick testing was placed, which includes aeroallergens/foods, histamine control, and saline control.  Verbal consent was obtained prior to placing test.  Patient tolerated procedure well.  Allergy testing results were read and interpreted by myself, documented by clinical staff. Adequate positive and negative control.  Results discussed with patient/family.  Airborne Adult Perc - 08/12/22 1008     Time Antigen Placed 1009    Allergen Manufacturer Waynette Buttery    Location Back    Number of Test 55    Panel 1 Select    2. Control-Histamine 3+    3. Bahia Negative    4. French Southern Territories Negative    5. Johnson Negative    6. [place] Blue Negative    7. Meadow Fescue Negative    8. Perennial Rye Negative    9. Timothy Negative    10. Ragweed Mix Negative    11. Cocklebur Negative    12. Plantain,  English Negative    13. Baccharis Negative    14. Dog Fennel Negative    15. Russian Thistle Negative    16. Lamb's Quarters Negative    17. Sheep Sorrell Negative    18. Rough Pigweed Negative    19. Marsh Elder, Rough Negative    20. Mugwort, Common Negative    21. Box, Elder Negative    22. Cedar, red Negative    23. Sweet Gum Negative    24. Pecan Pollen Negative    25. Pine Mix Negative    26. Walnut, Black Pollen Negative    27. Red Mulberry Negative    28. Ash Mix Negative    29. Birch Mix Negative    30. Beech American Negative    31.  Cottonwood, Guinea-Bissau Negative    32. Hickory, White Negative    33. Maple Mix Negative    34. Oak, Guinea-[place] Mix Negative    35. Sycamore Eastern Negative    36. Alternaria Alternata Negative    37. Cladosporium Herbarum Negative    38. Aspergillus Mix Negative    39. Penicillium Mix Negative    40. Bipolaris Sorokiniana (Helminthosporium) Negative    41. Drechslera Spicifera (Curvularia) Negative    42. Mucor Plumbeus Negative    43. Fusarium Moniliforme Negative    44.  Aureobasidium Pullulans (pullulara) Negative    45. Rhizopus Oryzae Negative    46. Botrytis Cinera Negative    47. Epicoccum Nigrum Negative    48. Phoma Betae Negative    49. Dust Mite Mix 3+    50. Cat Hair 10,000 BAU/ml Negative    51.  Dog Epithelia Negative    52. Mixed Feathers Negative    53. Horse Epithelia Negative    54. Cockroach, German Negative    55. Tobacco Leaf Negative             Intradermal - 08/12/22 1051     Time Antigen Placed 1051    Allergen Manufacturer Greer    Location Arm    Number of Test 15    Intradermal Select    Control Negative    Bahia Negative    French Southern Territories Negative    Johnson Negative    7 Grass Negative    Ragweed Mix Negative    Weed Mix Negative    Tree Mix Negative    Mold 1 Negative    Mold 2 Negative    Mold 3 Negative    Mold 4 Negative    Cat Negative    Dog Negative    Cockroach Negative               Assessment:   1. Nasal turbinate hypertrophy   2. Perennial allergic rhinitis   3. Gastroesophageal reflux disease, unspecified whether esophagitis present   4. History of dry mouth   5. History of epistaxis     Plan/Recommendations:  Mixed Rhinitis: - Due to turbinate hypertrophy, seasonal symptoms and unresponsive to OTC meds, performed skin testing to identify aeroallergen triggers.   - Has components of vasomotor rhinitis also since he has rhinorrhea with exercise.  Tried Azelastine but caused significant dryness.  Tried Flonase which  caused nosebleeds.  Did also discuss possibility of viral illness as the cause for his symptoms in April.  - Positive skin test 08/2022: dust mite  - Avoidance measures discussed. - Use nasal saline rinses before nose sprays such as with Neilmed Sinus Rinse.  Use distilled water.   - Use Ipratroprium 1-2 sprays up to three times daily as needed for runny nose. Aim upward and outward. - Use Zyrtec 10 mg daily.  - Consider allergy shots as long term control of your symptoms by teaching your immune system to be more tolerant of your allergy triggers.   GERD -Continue Prilosec 20mg  daily; can try to wean to every other day but if symptoms uncontrolled go back to daily.  Continue follow up with GI.  -Avoid lying down for at least two hours after a meal or after drinking acidic beverages, like soda, or other caffeinated beverages. This can help to prevent stomach contents from flowing back into the esophagus. -Keep your head elevated while you sleep. Using an extra pillow or two can also help to prevent reflux. -Eat smaller and more frequent meals each day instead of a few large meals. This promotes digestion and can aid in preventing heartburn. -Wear loose-fitting clothes to ease pressure on the stomach, which can worsen heartburn and reflux. -Reduce excess weight around the midsection. This can ease pressure on the stomach. Such pressure can force some stomach contents back up the esophagus.      Return in about 6 weeks (around 09/23/2022).  Alesia Morin, MD [facility]

## 2022-08-13 DIAGNOSIS — E042 Nontoxic multinodular goiter: Secondary | ICD-10-CM | POA: Diagnosis not present

## 2022-08-13 DIAGNOSIS — R682 Dry mouth, unspecified: Secondary | ICD-10-CM | POA: Diagnosis not present

## 2022-08-13 DIAGNOSIS — E7800 Pure hypercholesterolemia, unspecified: Secondary | ICD-10-CM | POA: Diagnosis not present

## 2022-08-13 DIAGNOSIS — Z1331 Encounter for screening for depression: Secondary | ICD-10-CM | POA: Diagnosis not present

## 2022-08-13 DIAGNOSIS — Z0000 Encounter for general adult medical examination without abnormal findings: Secondary | ICD-10-CM | POA: Diagnosis not present

## 2022-08-13 DIAGNOSIS — Z79899 Other long term (current) drug therapy: Secondary | ICD-10-CM | POA: Diagnosis not present

## 2022-08-13 DIAGNOSIS — I77810 Thoracic aortic ectasia: Secondary | ICD-10-CM | POA: Diagnosis not present

## 2022-08-13 DIAGNOSIS — E291 Testicular hypofunction: Secondary | ICD-10-CM | POA: Diagnosis not present

## 2022-08-13 DIAGNOSIS — E538 Deficiency of other specified B group vitamins: Secondary | ICD-10-CM | POA: Diagnosis not present

## 2022-08-13 DIAGNOSIS — K219 Gastro-esophageal reflux disease without esophagitis: Secondary | ICD-10-CM | POA: Diagnosis not present

## 2022-08-13 DIAGNOSIS — Z23 Encounter for immunization: Secondary | ICD-10-CM | POA: Diagnosis not present

## 2022-08-13 DIAGNOSIS — E559 Vitamin D deficiency, unspecified: Secondary | ICD-10-CM | POA: Diagnosis not present

## 2022-08-13 DIAGNOSIS — Z125 Encounter for screening for malignant neoplasm of prostate: Secondary | ICD-10-CM | POA: Diagnosis not present

## 2022-08-13 DIAGNOSIS — H409 Unspecified glaucoma: Secondary | ICD-10-CM | POA: Diagnosis not present

## 2022-08-18 ENCOUNTER — Other Ambulatory Visit: Payer: Self-pay | Admitting: Internal Medicine

## 2022-08-18 DIAGNOSIS — I77810 Thoracic aortic ectasia: Secondary | ICD-10-CM

## 2022-08-25 ENCOUNTER — Ambulatory Visit
Admission: RE | Admit: 2022-08-25 | Discharge: 2022-08-25 | Disposition: A | Discharge status: Home / Self Care | Payer: Medicare PPO | Source: Ambulatory Visit | Attending: Internal Medicine | Admitting: Internal Medicine

## 2022-08-25 DIAGNOSIS — I77810 Thoracic aortic ectasia: Secondary | ICD-10-CM | POA: Diagnosis not present

## 2022-08-25 MED ORDER — IOPAMIDOL (ISOVUE-370) INJECTION 76%
75.0000 mL | Freq: Once | INTRAVENOUS | Status: AC | PRN
  Administered 2022-08-25: 75 mL via INTRAVENOUS

## 2022-08-26 DIAGNOSIS — D692 Other nonthrombocytopenic purpura: Secondary | ICD-10-CM | POA: Diagnosis not present

## 2022-08-26 DIAGNOSIS — L814 Other melanin hyperpigmentation: Secondary | ICD-10-CM | POA: Diagnosis not present

## 2022-08-26 DIAGNOSIS — L821 Other seborrheic keratosis: Secondary | ICD-10-CM | POA: Diagnosis not present

## 2022-08-26 DIAGNOSIS — K130 Diseases of lips: Secondary | ICD-10-CM | POA: Diagnosis not present

## 2022-08-26 DIAGNOSIS — D1801 Hemangioma of skin and subcutaneous tissue: Secondary | ICD-10-CM | POA: Diagnosis not present

## 2022-08-26 DIAGNOSIS — B001 Herpesviral vesicular dermatitis: Secondary | ICD-10-CM | POA: Diagnosis not present

## 2022-08-26 DIAGNOSIS — L570 Actinic keratosis: Secondary | ICD-10-CM | POA: Diagnosis not present

## 2022-08-26 DIAGNOSIS — R202 Paresthesia of skin: Secondary | ICD-10-CM | POA: Diagnosis not present

## 2022-09-24 ENCOUNTER — Ambulatory Visit: Payer: BC Managed Care – PPO | Admitting: Internal Medicine

## 2022-09-26 ENCOUNTER — Ambulatory Visit: Payer: BC Managed Care – PPO | Admitting: Internal Medicine

## 2022-10-08 DIAGNOSIS — H401211 Low-tension glaucoma, right eye, mild stage: Secondary | ICD-10-CM | POA: Diagnosis not present

## 2022-10-08 DIAGNOSIS — H401222 Low-tension glaucoma, left eye, moderate stage: Secondary | ICD-10-CM | POA: Diagnosis not present

## 2022-10-08 DIAGNOSIS — H04123 Dry eye syndrome of bilateral lacrimal glands: Secondary | ICD-10-CM | POA: Diagnosis not present

## 2022-10-08 DIAGNOSIS — H43821 Vitreomacular adhesion, right eye: Secondary | ICD-10-CM | POA: Diagnosis not present

## 2022-10-08 DIAGNOSIS — H353132 Nonexudative age-related macular degeneration, bilateral, intermediate dry stage: Secondary | ICD-10-CM | POA: Diagnosis not present

## 2022-11-18 NOTE — Progress Notes (Unsigned)
Cardiology Office Note:  .   Date:  11/20/2022  ID:  Charles Winters, DOB 1950/02/23, MRN [id number] PCP: Emilio Aspen, MD  [facility] Providers Cardiologist:  None    History of Present Illness: .   Charles Winters is a 72 y.o. male with history of GERD, ascending aortic dilation, GERD who presents for the evaluation of ascending aortic dilation at the request of Emilio Aspen, MD.  Discussed the use of AI scribe software for clinical note transcription with the patient, who gave verbal consent to proceed.  History of Present Illness   Mr. Charles Winters, a 72 year old with a history of glaucoma and aortic ectasia, presents with concerns about his heart health. He has been proactive in monitoring his health, keeping detailed records of his medical tests since 1988. He has had three CT scans, the first of which revealed aortic ectasia of 4.1 cm and a calcium score of 1. The second scan showed a slight increase in the ectasia to 4.2 cm and noted calcification in the left anterior descending coronary artery. The third scan, ordered specifically to monitor the ectasia showed 40 mm. LAD calcification noted.  He is physically active, working out three to four times a week with a trainer and doing 30 minutes of elliptical exercise each session. He reports no chest pain or pressure during these workouts, but does note heavy breathing, which he attributes to improper breathing techniques. He also mentions occasional leg pain, which he describes as an ache near the ankle, and erectile dysfunction, for which he uses Viagra.  He has noticed that he feels fatigued for the rest of the day after a particularly intense workout. He also reports one incident of waking up at night with a fast heartbeat, but this has not recurred. He has no history of smoking, high blood pressure, heart attack, or stroke. His father had coronary artery disease and bypass surgery at age 71 and died at 53 from  lung cancer.           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 GERD 3. Coronary calcium -1 (17th percentile) -T chol 187, HDL 71, LDL 103, TG 71 4. Glaucoma  5. PACs    ROS: All other ROS reviewed and negative. Pertinent positives noted in the HPI.     Studies Reviewed: Marland Kitchen   EKG Interpretation Date/Time:  Thursday November 20 2022 08:47:43 EDT Ventricular Rate:  52 PR Interval:  144 QRS Duration:  90 QT Interval:  446 QTC Calculation: 414 R Axis:   80  Text Interpretation: Sinus bradycardia with Premature atrial complexes Confirmed by Lennie Odor [phone]) on 11/20/2022 8:49:22 AM   Physical Exam:   VS:  BP 122/78 (BP Location: Left Arm, Patient Position: Sitting, Cuff Size: Normal)   Pulse (!) 52   Ht 5\' 6"  (1.676 m)   Wt 145 lb 6.4 oz (66 kg)   SpO2 98%   BMI 23.47 kg/m    Wt Readings from Last 3 Encounters:  11/20/22 145 lb 6.4 oz (66 kg)  08/12/22 145 lb 6.4 oz (66 kg)  10/11/20 140 lb 9.6 oz (63.8 kg)    GEN: Well nourished, well developed in no acute distress NECK: No JVD; No carotid bruits CARDIAC: RRR, no murmurs, rubs, gallops RESPIRATORY:  Clear to auscultation without rales, wheezing or rhonchi  ABDOMEN: Soft, non-tender, non-distended EXTREMITIES:  No edema; No deformity  ASSESSMENT AND PLAN: .   Assessment and Plan  Aortic Ectasia  Stable dilation of the aorta up to 4.0 cm over the past few years. No symptoms of angina during exercise. -Plan to recheck with CT scan in 2 years. If stable, no further monitoring needed.  SOB -EKG normal. Exam normal. No symptoms of angina. Suspect non-cardiac. No further testing. Symptoms occur with heavy activity and are expected.    Calcium score of 1 (17th percentile) -Speck of calcification in the left anterior descending artery. LDL 103, HDL 71. No symptoms of angina during exercise. -No treatment required at this time. Continue regular check-ups with primary care  physician.  Premature Atrial Contractions (PACs) Single PAC noted on EKG. No symptoms of heart racing or irregular heartbeat. -No treatment needed. Monitor for any symptoms.   Follow-up PRN              Follow-up: Return if symptoms worsen or fail to improve.  Signed, Lenna Gilford. Flora Lipps, MD, [facility]  [facility]  [address] [place], [place] [address] [phone]  9:20 AM

## 2022-11-20 ENCOUNTER — Ambulatory Visit: Payer: Medicare PPO | Attending: Cardiovascular Disease | Admitting: Cardiovascular Disease

## 2022-11-20 ENCOUNTER — Encounter: Payer: Self-pay | Admitting: Cardiovascular Disease

## 2022-11-20 VITALS — BP 122/78 | HR 52 | Ht 66.0 in | Wt 145.4 lb

## 2022-11-20 DIAGNOSIS — R0602 Shortness of breath: Secondary | ICD-10-CM

## 2022-11-20 DIAGNOSIS — I77810 Thoracic aortic ectasia: Secondary | ICD-10-CM

## 2022-11-20 DIAGNOSIS — I491 Atrial premature depolarization: Secondary | ICD-10-CM

## 2022-11-20 NOTE — Patient Instructions (Signed)
Medication Instructions:  No changes    *If you need a refill on your cardiac medications before your next appointment, please call your pharmacy*   Lab Work: None    If you have labs (blood work) drawn today and your tests are completely normal, you will receive your results only by: MyChart Message (if you have MyChart) OR A paper copy in the mail If you have any lab test that is abnormal or we need to change your treatment, we will call you to review the results.   Testing/Procedures: None    Follow-Up: At [facility], you and your health needs are our priority.  As part of our continuing mission to provide you with exceptional heart care, we have created designated Provider Care Teams.  These Care Teams include your primary Cardiologist (physician) and Advanced Practice Providers (APPs -  Physician Assistants and Nurse Practitioners) who all work together to provide you with the care you need, when you need it.  We recommend signing up for the patient portal called "MyChart".  Sign up information is provided on this After Visit Summary.  MyChart is used to connect with patients for Virtual Visits (Telemedicine).  Patients are able to view lab/test results, encounter notes, upcoming appointments, etc.  Non-urgent messages can be sent to your provider as well.   To learn more about what you can do with MyChart, go to [email or web address].    Your next appointment:   Follow up as needed    Provider:   Dr. Velna Ochs, MD

## 2022-11-25 DIAGNOSIS — E291 Testicular hypofunction: Secondary | ICD-10-CM | POA: Diagnosis not present

## 2022-11-25 DIAGNOSIS — N529 Male erectile dysfunction, unspecified: Secondary | ICD-10-CM | POA: Diagnosis not present

## 2023-02-10 ENCOUNTER — Other Ambulatory Visit: Payer: Self-pay | Admitting: Medical Genetics

## 2023-02-12 ENCOUNTER — Other Ambulatory Visit (HOSPITAL_COMMUNITY)
Admission: RE | Admit: 2023-02-12 | Discharge: 2023-02-12 | Disposition: A | Discharge status: Home / Self Care | Payer: Self-pay | Source: Ambulatory Visit | Attending: Medical Genetics | Admitting: Medical Genetics

## 2023-02-24 LAB — GENECONNECT MOLECULAR SCREEN: Genetic Analysis Overall Interpretation: NEGATIVE

## 2023-06-01 IMAGING — CT CT CHEST W/O CM
2 of 4 series · 14 of 36 positions shown, 17 images · non-contrast
Comparison: Coronary calcium score exam 08/25/2019.

CLINICAL DATA: 70-year-old male with history of pulmonary nodules.
Follow-up study.

EXAM:
CT CHEST WITHOUT CONTRAST
TECHNIQUE: Multidetector CT imaging of the chest was performed following the
standard protocol without IV contrast.

[Series 2: thorax · axial · 0.61mm/px · z∈[-353,-45]mm · 11 of 183 slices shown, 14 images]
[im 15/183  mediastinal]
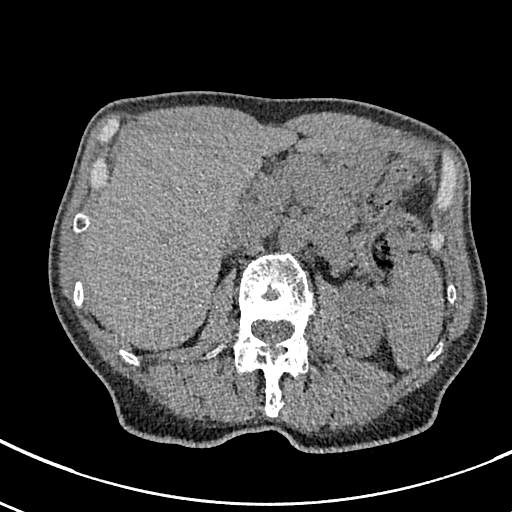
[im 15/183  lung]
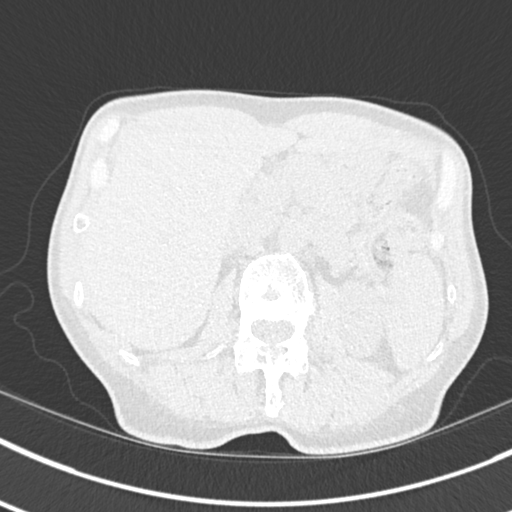
[im 29/183  lung]
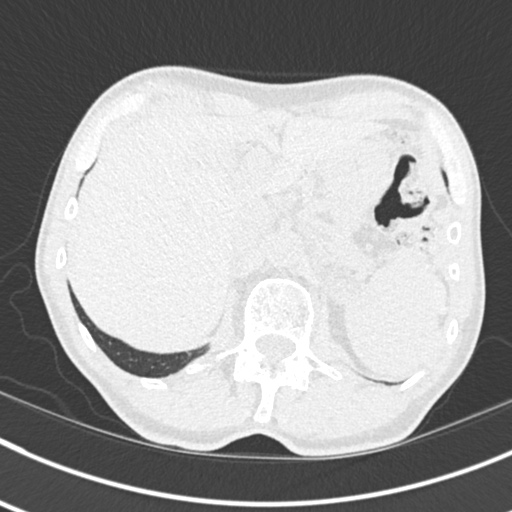
[im 43/183  lung]
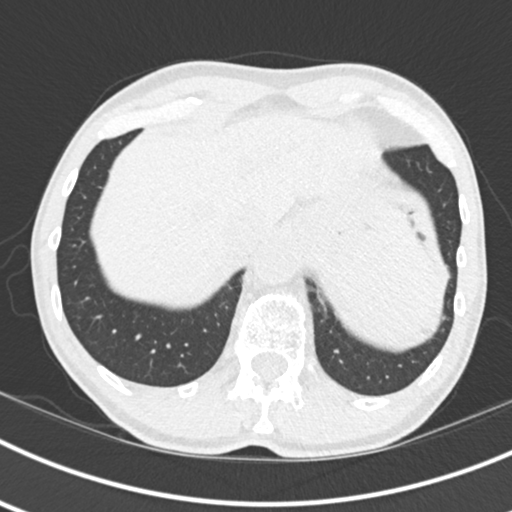
[im 57/183  lung]
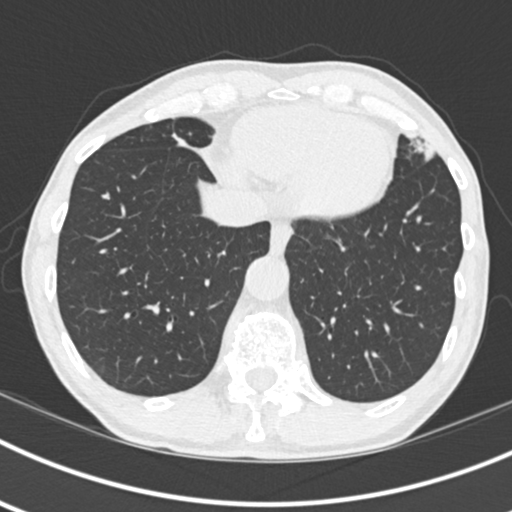
[im 71/183  mediastinal]
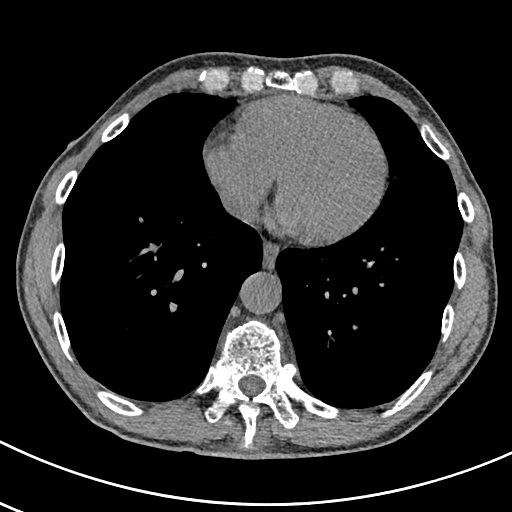
[im 71/183  lung]
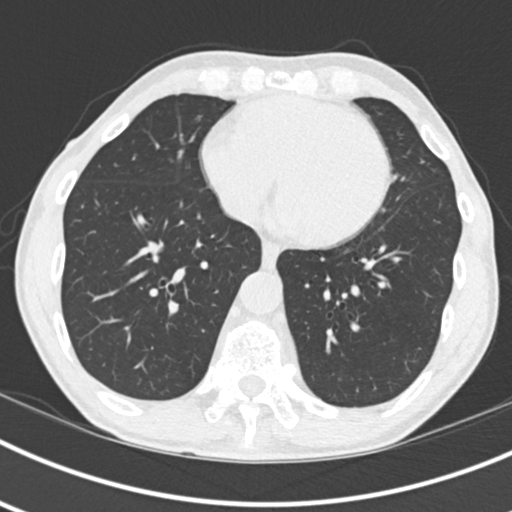
[im 99/183  lung]
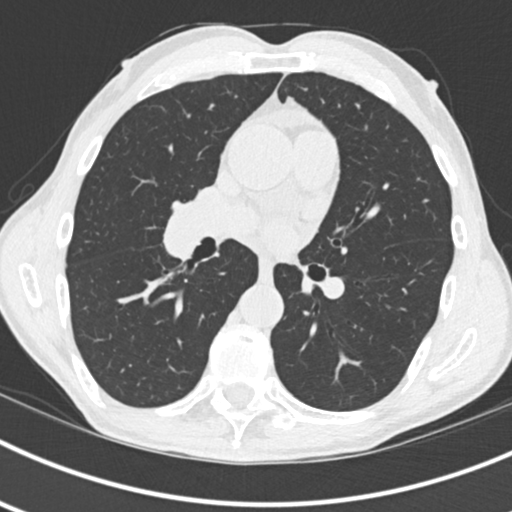
[im 113/183  lung]
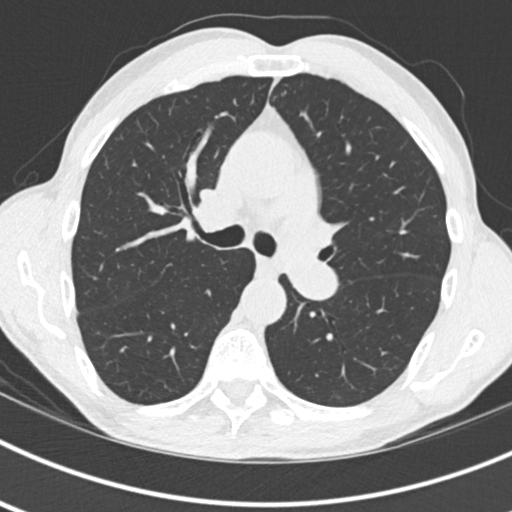
[im 127/183  lung]
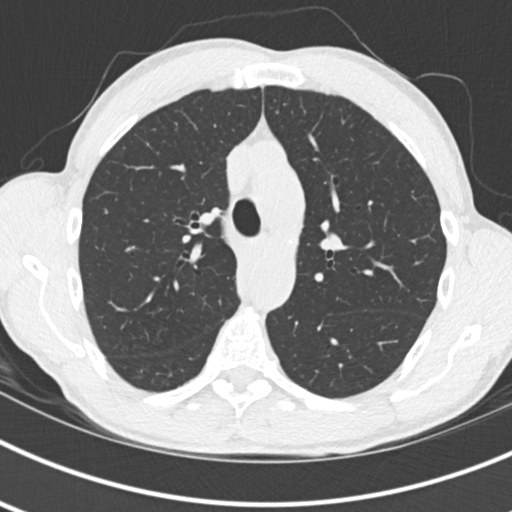
[im 141/183  mediastinal]
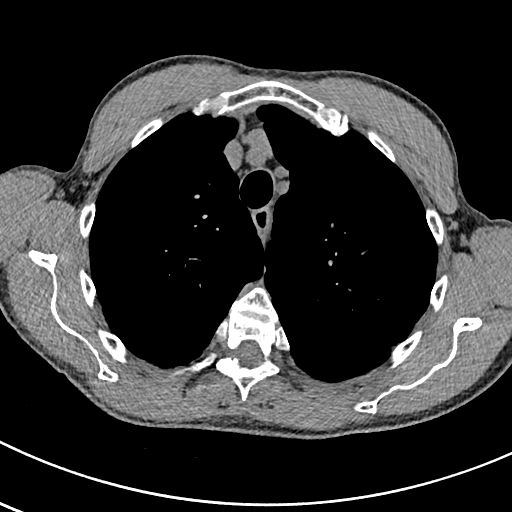
[im 141/183  lung]
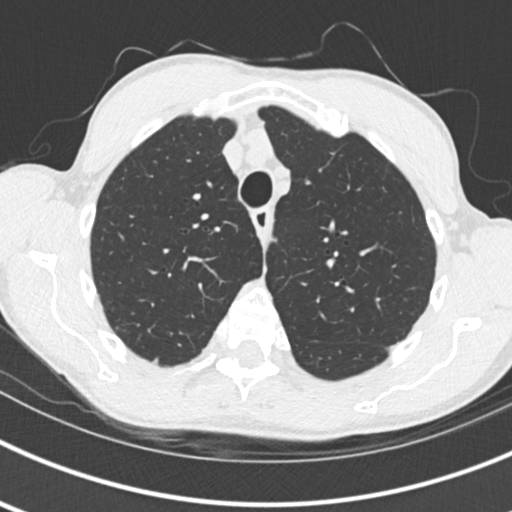
[im 155/183  lung]
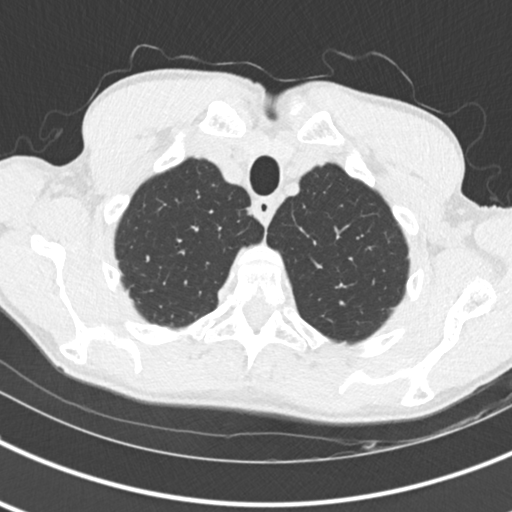
[im 169/183  lung]
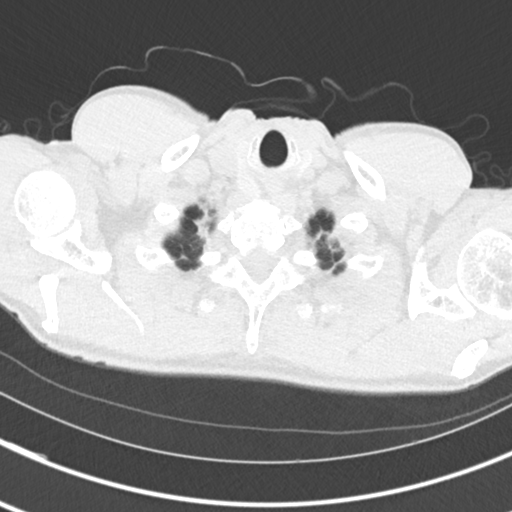

[Series 5: coronal · coronal · 0.59mm/px · 3 of 130 slices shown]
[im 26/130  lung]
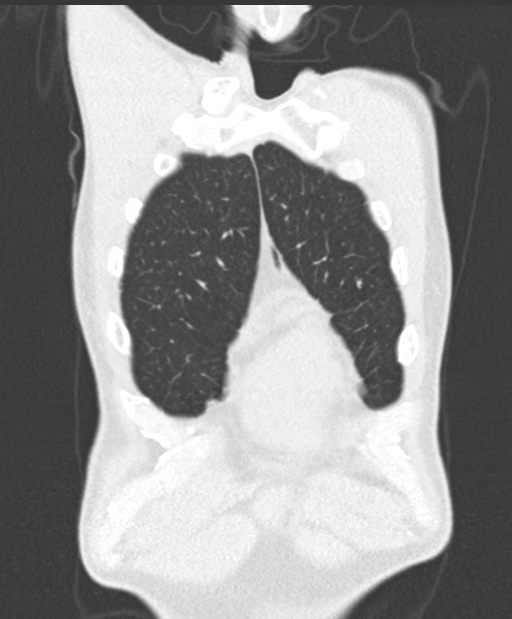
[im 52/130  lung]
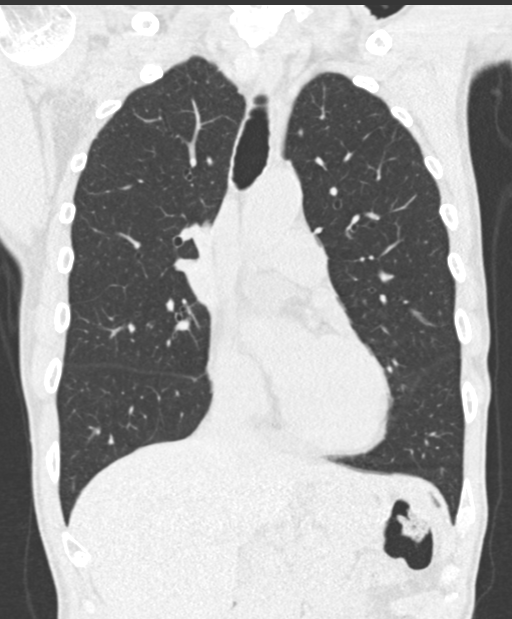
[im 78/130  lung]
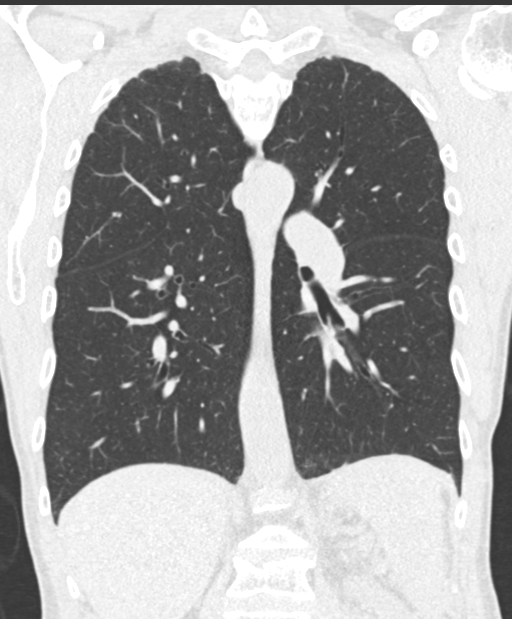

[14 of 36 positions shown; findings below may reference images not displayed]

FINDINGS: Cardiovascular: Heart size is normal. There is no significant
pericardial fluid, thickening or pericardial calcification. Aortic
atherosclerosis with ectasia of the ascending thoracic aorta (4.2 cm
in diameter). Atherosclerotic calcifications are also noted in the
left anterior descending coronary artery.

Mediastinum/Nodes: No pathologically enlarged mediastinal or hilar
lymph nodes. Please note that accurate exclusion of hilar adenopathy
is limited on noncontrast CT scans. Esophagus is unremarkable in
appearance. No axillary lymphadenopathy.

Lungs/Pleura: 4 mm right lower lobe pulmonary nodule (axial image 98
of series 3), stable. 3 mm subpleural nodule in the periphery of the
left lower lobe (axial image 80 of series 3), stable. No other
suspicious appearing pulmonary nodules or masses are noted. No acute
consolidative airspace disease. No pleural effusions. Chronic areas
of post infectious or inflammatory scarring are noted in the medial
segment of the right middle lobe and inferior segment of the lingula
where there are some areas of mild cylindrical bronchiectasis and
areas of chronic mucoid impaction within terminal bronchioles.

Upper Abdomen: Status post cholecystectomy.

Musculoskeletal: There are no aggressive appearing lytic or blastic
lesions noted in the visualized portions of the skeleton.
IMPRESSION: 1. Small pulmonary nodules measuring 4 mm or less in size, stable
compared to prior study from 08/25/2019, considered definitively
benign requiring no future imaging follow-up.
2. Scattered areas of mild cylindrical bronchiectasis, mucoid
impaction and post infectious or inflammatory scarring most evident
in the medial segment of the right middle lobe and inferior segment
of the lingula.
3. Aortic atherosclerosis, in addition to left anterior descending
coronary artery disease. Assessment for potential risk factor
modification, dietary therapy or pharmacologic therapy may be
warranted, if clinically indicated.
4. There is also ectasia of the ascending thoracic aorta (4.2 cm in
diameter). Recommend annual imaging followup by CTA or MRA. This
recommendation follows 8010
ACCF/AHA/AATS/ACR/ASA/SCA/IQBAL/HAROLD/HARPAL/GODENSCHWEIG Guidelines for the
Diagnosis and Management of Patients with Thoracic Aortic Disease.
Circulation. 8010; 121: [id number]. Aortic aneurysm NOS
(6GIW0-7WX.H).

Aortic Atherosclerosis (6GIW0-NP2.2).

## 2023-07-22 DIAGNOSIS — H04123 Dry eye syndrome of bilateral lacrimal glands: Secondary | ICD-10-CM | POA: Diagnosis not present

## 2023-07-22 DIAGNOSIS — H35371 Puckering of macula, right eye: Secondary | ICD-10-CM | POA: Diagnosis not present

## 2023-07-22 DIAGNOSIS — H401222 Low-tension glaucoma, left eye, moderate stage: Secondary | ICD-10-CM | POA: Diagnosis not present

## 2023-07-22 DIAGNOSIS — H401211 Low-tension glaucoma, right eye, mild stage: Secondary | ICD-10-CM | POA: Diagnosis not present

## 2023-08-25 DIAGNOSIS — H903 Sensorineural hearing loss, bilateral: Secondary | ICD-10-CM | POA: Diagnosis not present

## 2023-08-27 DIAGNOSIS — L570 Actinic keratosis: Secondary | ICD-10-CM | POA: Diagnosis not present

## 2023-08-27 DIAGNOSIS — L814 Other melanin hyperpigmentation: Secondary | ICD-10-CM | POA: Diagnosis not present

## 2023-08-27 DIAGNOSIS — L821 Other seborrheic keratosis: Secondary | ICD-10-CM | POA: Diagnosis not present

## 2023-08-27 DIAGNOSIS — S90112A Contusion of left great toe without damage to nail, initial encounter: Secondary | ICD-10-CM | POA: Diagnosis not present

## 2023-08-27 DIAGNOSIS — D225 Melanocytic nevi of trunk: Secondary | ICD-10-CM | POA: Diagnosis not present

## 2023-09-17 DIAGNOSIS — H903 Sensorineural hearing loss, bilateral: Secondary | ICD-10-CM | POA: Diagnosis not present

## 2023-10-15 DIAGNOSIS — E538 Deficiency of other specified B group vitamins: Secondary | ICD-10-CM | POA: Diagnosis not present

## 2023-10-15 DIAGNOSIS — E291 Testicular hypofunction: Secondary | ICD-10-CM | POA: Diagnosis not present

## 2023-10-15 DIAGNOSIS — E559 Vitamin D deficiency, unspecified: Secondary | ICD-10-CM | POA: Diagnosis not present

## 2023-10-15 DIAGNOSIS — E042 Nontoxic multinodular goiter: Secondary | ICD-10-CM | POA: Diagnosis not present

## 2023-10-15 DIAGNOSIS — E7800 Pure hypercholesterolemia, unspecified: Secondary | ICD-10-CM | POA: Diagnosis not present

## 2023-10-15 DIAGNOSIS — H409 Unspecified glaucoma: Secondary | ICD-10-CM | POA: Diagnosis not present

## 2023-10-15 DIAGNOSIS — Z125 Encounter for screening for malignant neoplasm of prostate: Secondary | ICD-10-CM | POA: Diagnosis not present

## 2023-10-15 DIAGNOSIS — J479 Bronchiectasis, uncomplicated: Secondary | ICD-10-CM | POA: Diagnosis not present

## 2023-10-15 DIAGNOSIS — I77810 Thoracic aortic ectasia: Secondary | ICD-10-CM | POA: Diagnosis not present

## 2023-10-15 DIAGNOSIS — Z1331 Encounter for screening for depression: Secondary | ICD-10-CM | POA: Diagnosis not present

## 2023-10-15 DIAGNOSIS — G4700 Insomnia, unspecified: Secondary | ICD-10-CM | POA: Diagnosis not present

## 2023-10-15 DIAGNOSIS — Z79899 Other long term (current) drug therapy: Secondary | ICD-10-CM | POA: Diagnosis not present

## 2023-10-15 DIAGNOSIS — K219 Gastro-esophageal reflux disease without esophagitis: Secondary | ICD-10-CM | POA: Diagnosis not present

## 2023-10-15 DIAGNOSIS — Z0000 Encounter for general adult medical examination without abnormal findings: Secondary | ICD-10-CM | POA: Diagnosis not present

## 2023-11-30 DIAGNOSIS — R972 Elevated prostate specific antigen [PSA]: Secondary | ICD-10-CM | POA: Diagnosis not present

## 2024-01-13 DIAGNOSIS — H401211 Low-tension glaucoma, right eye, mild stage: Secondary | ICD-10-CM | POA: Diagnosis not present

## 2024-01-13 DIAGNOSIS — H04123 Dry eye syndrome of bilateral lacrimal glands: Secondary | ICD-10-CM | POA: Diagnosis not present

## 2024-01-13 DIAGNOSIS — H26493 Other secondary cataract, bilateral: Secondary | ICD-10-CM | POA: Diagnosis not present

## 2024-01-13 DIAGNOSIS — H401222 Low-tension glaucoma, left eye, moderate stage: Secondary | ICD-10-CM | POA: Diagnosis not present

## 2024-01-13 DIAGNOSIS — H35373 Puckering of macula, bilateral: Secondary | ICD-10-CM | POA: Diagnosis not present

## 2024-01-13 DIAGNOSIS — H353131 Nonexudative age-related macular degeneration, bilateral, early dry stage: Secondary | ICD-10-CM | POA: Diagnosis not present
